# Patient Record
Sex: Female | Born: 1945 | Race: White | Hispanic: No | Marital: Married | Smoking: Never smoker
Health system: Southern US, Community
[De-identification: ages and names within clinical notes are randomized; demographics above are authoritative.]

## PROBLEM LIST (undated history)

## (undated) DIAGNOSIS — I781 Nevus, non-neoplastic: Secondary | ICD-10-CM

## (undated) DIAGNOSIS — L821 Other seborrheic keratosis: Secondary | ICD-10-CM

## (undated) DIAGNOSIS — E785 Hyperlipidemia, unspecified: Secondary | ICD-10-CM

## (undated) DIAGNOSIS — D1801 Hemangioma of skin and subcutaneous tissue: Secondary | ICD-10-CM

## (undated) DIAGNOSIS — M810 Age-related osteoporosis without current pathological fracture: Secondary | ICD-10-CM

## (undated) DIAGNOSIS — M8580 Other specified disorders of bone density and structure, unspecified site: Secondary | ICD-10-CM

## (undated) DIAGNOSIS — M2042 Other hammer toe(s) (acquired), left foot: Secondary | ICD-10-CM

## (undated) DIAGNOSIS — I10 Essential (primary) hypertension: Secondary | ICD-10-CM

## (undated) DIAGNOSIS — F419 Anxiety disorder, unspecified: Secondary | ICD-10-CM

## (undated) DIAGNOSIS — M2041 Other hammer toe(s) (acquired), right foot: Secondary | ICD-10-CM

## (undated) DIAGNOSIS — F329 Major depressive disorder, single episode, unspecified: Secondary | ICD-10-CM

## (undated) DIAGNOSIS — M2012 Hallux valgus (acquired), left foot: Secondary | ICD-10-CM

## (undated) DIAGNOSIS — D72819 Decreased white blood cell count, unspecified: Secondary | ICD-10-CM

## (undated) DIAGNOSIS — A048 Other specified bacterial intestinal infections: Secondary | ICD-10-CM

## (undated) DIAGNOSIS — M419 Scoliosis, unspecified: Secondary | ICD-10-CM

## (undated) DIAGNOSIS — I351 Nonrheumatic aortic (valve) insufficiency: Secondary | ICD-10-CM

## (undated) DIAGNOSIS — E7800 Pure hypercholesterolemia, unspecified: Secondary | ICD-10-CM

## (undated) DIAGNOSIS — D649 Anemia, unspecified: Secondary | ICD-10-CM

## (undated) DIAGNOSIS — F32A Depression, unspecified: Secondary | ICD-10-CM

## (undated) DIAGNOSIS — L309 Dermatitis, unspecified: Secondary | ICD-10-CM

## (undated) DIAGNOSIS — E559 Vitamin D deficiency, unspecified: Secondary | ICD-10-CM

## (undated) DIAGNOSIS — I340 Nonrheumatic mitral (valve) insufficiency: Secondary | ICD-10-CM

## (undated) HISTORY — DX: Dermatitis, unspecified: L30.9

## (undated) HISTORY — DX: Nonrheumatic aortic (valve) insufficiency: I35.1

## (undated) HISTORY — DX: Decreased white blood cell count, unspecified: D72.819

## (undated) HISTORY — DX: Other seborrheic keratosis: L82.1

## (undated) HISTORY — DX: Age-related osteoporosis without current pathological fracture: M81.0

## (undated) HISTORY — DX: Scoliosis, unspecified: M41.9

## (undated) HISTORY — DX: Hemangioma of skin and subcutaneous tissue: D18.01

## (undated) HISTORY — PX: COLONOSCOPY: SHX174

## (undated) HISTORY — DX: Major depressive disorder, single episode, unspecified: F32.9

## (undated) HISTORY — DX: Anxiety disorder, unspecified: F41.9

## (undated) HISTORY — DX: Depression, unspecified: F32.A

## (undated) HISTORY — DX: Essential (primary) hypertension: I10

## (undated) HISTORY — DX: Anemia, unspecified: D64.9

## (undated) HISTORY — PX: TUBAL LIGATION: SHX77

## (undated) HISTORY — DX: Hyperlipidemia, unspecified: E78.5

## (undated) HISTORY — DX: Nevus, non-neoplastic: I78.1

## (undated) HISTORY — PX: BUNIONECTOMY: SHX129

## (undated) HISTORY — PX: TONSILLECTOMY: SUR1361

## (undated) HISTORY — PX: OTHER SURGICAL HISTORY: SHX169

## (undated) HISTORY — DX: Hallux valgus (acquired), left foot: M20.12

## (undated) HISTORY — DX: Other specified bacterial intestinal infections: A04.8

## (undated) HISTORY — DX: Vitamin D deficiency, unspecified: E55.9

## (undated) HISTORY — DX: Nonrheumatic mitral (valve) insufficiency: I34.0

## (undated) HISTORY — DX: Pure hypercholesterolemia, unspecified: E78.00

## (undated) HISTORY — DX: Other specified disorders of bone density and structure, unspecified site: M85.80

## (undated) HISTORY — DX: Other hammer toe(s) (acquired), left foot: M20.42

## (undated) HISTORY — DX: Other hammer toe(s) (acquired), right foot: M20.41

---

## 1997-05-10 ENCOUNTER — Other Ambulatory Visit: Admission: RE | Admit: 1997-05-10 | Discharge: 1997-05-10 | Payer: Self-pay | Admitting: *Deleted

## 1997-07-02 ENCOUNTER — Other Ambulatory Visit: Admission: RE | Admit: 1997-07-02 | Discharge: 1997-07-02 | Payer: Self-pay | Admitting: *Deleted

## 1998-05-21 ENCOUNTER — Other Ambulatory Visit: Admission: RE | Admit: 1998-05-21 | Discharge: 1998-05-21 | Payer: Self-pay | Admitting: *Deleted

## 1998-06-25 ENCOUNTER — Encounter: Payer: Self-pay | Admitting: Emergency Medicine

## 1998-06-25 ENCOUNTER — Emergency Department (HOSPITAL_COMMUNITY): Admission: EM | Admit: 1998-06-25 | Discharge: 1998-06-25 | Payer: Self-pay | Admitting: Emergency Medicine

## 1999-05-27 ENCOUNTER — Other Ambulatory Visit: Admission: RE | Admit: 1999-05-27 | Discharge: 1999-05-27 | Payer: Self-pay | Admitting: *Deleted

## 2000-05-31 ENCOUNTER — Other Ambulatory Visit: Admission: RE | Admit: 2000-05-31 | Discharge: 2000-05-31 | Payer: Self-pay | Admitting: *Deleted

## 2002-05-03 ENCOUNTER — Other Ambulatory Visit: Admission: RE | Admit: 2002-05-03 | Discharge: 2002-05-03 | Payer: Self-pay | Admitting: Internal Medicine

## 2003-10-11 ENCOUNTER — Ambulatory Visit (HOSPITAL_COMMUNITY): Admission: RE | Admit: 2003-10-11 | Discharge: 2003-10-11 | Payer: Self-pay | Admitting: *Deleted

## 2003-10-11 ENCOUNTER — Encounter (INDEPENDENT_AMBULATORY_CARE_PROVIDER_SITE_OTHER): Payer: Self-pay | Admitting: Specialist

## 2006-07-04 ENCOUNTER — Other Ambulatory Visit: Admission: RE | Admit: 2006-07-04 | Discharge: 2006-07-04 | Payer: Self-pay | Admitting: Internal Medicine

## 2009-11-03 ENCOUNTER — Other Ambulatory Visit
Admission: RE | Admit: 2009-11-03 | Discharge: 2009-11-03 | Payer: Self-pay | Source: Home / Self Care | Admitting: Internal Medicine

## 2009-12-30 ENCOUNTER — Encounter: Admission: RE | Admit: 2009-12-30 | Discharge: 2009-12-30 | Payer: Self-pay | Admitting: Internal Medicine

## 2010-01-15 ENCOUNTER — Encounter
Admission: RE | Admit: 2010-01-15 | Discharge: 2010-01-15 | Payer: Self-pay | Source: Home / Self Care | Attending: Internal Medicine | Admitting: Internal Medicine

## 2010-01-28 ENCOUNTER — Ambulatory Visit (HOSPITAL_COMMUNITY)
Admission: RE | Admit: 2010-01-28 | Discharge: 2010-01-28 | Payer: Self-pay | Source: Home / Self Care | Attending: Cardiology | Admitting: Cardiology

## 2010-01-28 ENCOUNTER — Encounter: Payer: Self-pay | Admitting: Cardiology

## 2010-01-28 ENCOUNTER — Ambulatory Visit: Payer: Self-pay

## 2010-02-03 ENCOUNTER — Ambulatory Visit: Payer: Self-pay | Admitting: Cardiology

## 2010-02-22 ENCOUNTER — Encounter: Payer: Self-pay | Admitting: Internal Medicine

## 2010-06-19 NOTE — Op Note (Signed)
NAMEJUSTYCE, YEATER                          ACCOUNT NO.:  [id number]   MEDICAL RECORD NO.:  [id number]                   PATIENT TYPE:  AMB   LOCATION:  ENDO                                 FACILITY:  [facility]   PHYSICIAN:  Georgiana Spinner, M.D.                 DATE OF BIRTH:  [birth year]   DATE OF PROCEDURE:  DATE OF DISCHARGE:                                 OPERATIVE REPORT   PROCEDURE:  Colonoscopy.   INDICATIONS:  Colon cancer screening.   ANESTHESIA:  Demerol 20, Versed 3 mg.   PROCEDURE:  With the patient mildly sedated in the left lateral decubitus  position, the Olympus videoscopic colonoscope was inserted into the rectum  and passed under direct vision to the cecum, identified by the ileocecal  valve and the appendiceal orifice, both of which were photographed.  From  this point, the colonoscope was then slowly withdrawn, taking  circumferential views of the colonic mucosa, stopping only in the rectum,  which appeared normal on direct and showed hemorrhoids on retroflexed view.  The endoscope was straightened and withdrawn.  Patient's vital signs and  pulse oximetry remained stable.  Patient tolerated the procedure well  without apparent complications.   FINDINGS:  Internal hemorrhoids, otherwise unremarkable examination.   PLAN:  See endoscopy note for further details.                                               Georgiana Spinner, M.D.    GMO/MEDQ  D:  10/11/2003  T:  10/11/2003  Job:  [id number]

## 2010-06-19 NOTE — Op Note (Signed)
Kathleen Thomas, Kathleen Thomas                          ACCOUNT NO.:  [id number]   MEDICAL RECORD NO.:  [id number]                   PATIENT TYPE:  AMB   LOCATION:  ENDO                                 FACILITY:  [facility]   PHYSICIAN:  Georgiana Spinner, M.D.                 DATE OF BIRTH:  03-17-1945   DATE OF PROCEDURE:  10/11/2003  DATE OF DISCHARGE:                                 OPERATIVE REPORT   PROCEDURE:  Upper endoscopy with biopsy.   INDICATIONS FOR PROCEDURE:  Gastroesophageal reflux disease.   ANESTHESIA:  Demerol 50, Versed 5 mg.   DESCRIPTION OF PROCEDURE:  With the patient mildly sedated in the left  lateral decubitus position, the Olympus videoscopic endoscope was inserted  in the mouth and passed under direct vision through the esophagus which  appeared normal.  In the area of the distal esophagus, these Z line appeared  somewhat proximal to where the anatomical change was and therefore I  photographed and biopsied this to rule out Barrett's.  We entered in the  stomach. The fundus, body, antrum, duodenal bulb and second portion of the  duodenum all appeared normal. From this point, the endoscope was slowly  withdrawn taking circumferential views of the duodenal mucosa until the  endoscope was then pulled back in the stomach, placed in retroflexion to  view the stomach from below. The endoscope was straightened and withdrawn  taking circumferential views of the remaining gastric and esophageal mucosa.  The patient's vital signs and pulse oximeter remained stable. The patient  tolerated the procedure well without apparent complications.   FINDINGS:  Question of Barrett's esophagus biopsied, await biopsy report.  The patient will call me for results and followup with me as an outpatient.  Proceed to colonoscopy.                                               Georgiana Spinner, M.D.    GMO/MEDQ  D:  10/11/2003  T:  10/11/2003  Job:  [id number]

## 2010-12-10 ENCOUNTER — Other Ambulatory Visit: Payer: Self-pay | Admitting: Internal Medicine

## 2010-12-10 DIAGNOSIS — Z1231 Encounter for screening mammogram for malignant neoplasm of breast: Secondary | ICD-10-CM

## 2011-01-20 ENCOUNTER — Encounter: Payer: Self-pay | Admitting: *Deleted

## 2011-01-20 DIAGNOSIS — I351 Nonrheumatic aortic (valve) insufficiency: Secondary | ICD-10-CM | POA: Insufficient documentation

## 2011-01-20 DIAGNOSIS — I340 Nonrheumatic mitral (valve) insufficiency: Secondary | ICD-10-CM | POA: Insufficient documentation

## 2011-01-22 ENCOUNTER — Ambulatory Visit (INDEPENDENT_AMBULATORY_CARE_PROVIDER_SITE_OTHER): Payer: PRIVATE HEALTH INSURANCE | Admitting: Cardiology

## 2011-01-22 ENCOUNTER — Ambulatory Visit: Payer: Self-pay

## 2011-01-22 ENCOUNTER — Ambulatory Visit
Admission: RE | Admit: 2011-01-22 | Discharge: 2011-01-22 | Disposition: A | Discharge status: Home / Self Care | Payer: PRIVATE HEALTH INSURANCE | Source: Ambulatory Visit | Attending: Internal Medicine | Admitting: Internal Medicine

## 2011-01-22 ENCOUNTER — Encounter: Payer: Self-pay | Admitting: Cardiology

## 2011-01-22 VITALS — BP 120/78 | Ht 66.0 in | Wt 138.0 lb

## 2011-01-22 DIAGNOSIS — R002 Palpitations: Secondary | ICD-10-CM

## 2011-01-22 DIAGNOSIS — R634 Abnormal weight loss: Secondary | ICD-10-CM | POA: Insufficient documentation

## 2011-01-22 DIAGNOSIS — E785 Hyperlipidemia, unspecified: Secondary | ICD-10-CM

## 2011-01-22 DIAGNOSIS — I119 Hypertensive heart disease without heart failure: Secondary | ICD-10-CM

## 2011-01-22 DIAGNOSIS — I359 Nonrheumatic aortic valve disorder, unspecified: Secondary | ICD-10-CM

## 2011-01-22 DIAGNOSIS — I340 Nonrheumatic mitral (valve) insufficiency: Secondary | ICD-10-CM

## 2011-01-22 DIAGNOSIS — Z1231 Encounter for screening mammogram for malignant neoplasm of breast: Secondary | ICD-10-CM

## 2011-01-22 DIAGNOSIS — I493 Ventricular premature depolarization: Secondary | ICD-10-CM

## 2011-01-22 DIAGNOSIS — I4949 Other premature depolarization: Secondary | ICD-10-CM

## 2011-01-22 LAB — BASIC METABOLIC PANEL
BUN: 10 mg/dL (ref 6–23)
CO2: 29 mEq/L (ref 19–32)
Calcium: 9.4 mg/dL (ref 8.4–10.5)
Chloride: 106 mEq/L (ref 96–112)
Creatinine, Ser: 0.7 mg/dL (ref 0.4–1.2)
Glucose, Bld: 86 mg/dL (ref 70–99)

## 2011-01-22 NOTE — Assessment & Plan Note (Signed)
The patient has lost 12 pounds without trying over the past year.  She has not been having any symptoms of hyperthyroidism or hypothyroidism.  She is not sure if she has had any thyroid function studies recently and we will check a TSH.  We're also checking a basal metabolic panel today because of her PVCs.

## 2011-01-22 NOTE — Assessment & Plan Note (Signed)
The patient has not been experiencing any symptoms of congestive heart she is now on Cozaar for high blood pressure but this will also function as afterload reduction for her mitral regurgitation her aortic valve regurgitation.

## 2011-01-22 NOTE — Patient Instructions (Signed)
Will obtain labs today and call you with the results  Your physician recommends that you continue on your current medications as directed. Please refer to the Current Medication list given to you today.  Your physician wants you to follow-up in: 1 year---will get 2 D Echo prior to appointment You will receive a reminder letter in the mail two months in advance. If you don't receive a letter, please call our office to schedule the follow-up appointment.

## 2011-01-22 NOTE — Assessment & Plan Note (Signed)
Her PVCs are mildly symptomatic.  We will check electrolytes to be sure she is not hypokalemic.  It would be unlikely since she is on an ARB.  If no electrolyte imbalance is found the patient did not think she would want to go on beta blocker at this point unless the PVCs become more symptomatic

## 2011-01-22 NOTE — Progress Notes (Signed)
Kathleen Thomas Date of Birth:  1945-08-27 [facility] [address]   [address] [place], [place]  [address] [phone]           Fax   [phone]  History of Present Illness: This pleasant 65 year old woman is seen for a one-year followup office visit.  She has a history of palpitations and history of a heart murmur.  More recently she was started on blood pressure medicine by her primary care provider.  Last echocardiogram on 01/28/10 and at that time showed mild LVH, normal left systolic function with an ejection fraction 55-65%, and no wall motion abnormalities.  She also had grade 1 diastolic dysfunction.  She had mild aortic valve regurgitation and mild mitral valve regurgitation and the left atrium was moderately dilated.  She has not been having any symptoms of congestive heart failure.  She has lost 12 pounds in the past year without trying.  Current Outpatient Prescriptions  Medication Sig Dispense Refill  . aspirin 81 MG tablet Take 81 mg by mouth daily. Taking 3 x a week      . CALCIUM PO Take by mouth daily.        Marland Kitchen losartan (COZAAR) 100 MG tablet Take 100 mg by mouth daily.        . Multiple Vitamin (MULTIVITAMIN) tablet Take 1 tablet by mouth daily.        . Omega-3 Fatty Acids (FISH OIL PO) Take by mouth daily.          No Known Allergies  Patient Active Problem List  Diagnoses  . Mitral regurgitation  . Aortic insufficiency    History  Smoking status  . Never Smoker   Smokeless tobacco  . Not on file    History  Alcohol Use     No family history on file.  Review of Systems: Constitutional: no fever chills diaphoresis or fatigue or change in weight.  Head and neck: no hearing loss, no epistaxis, no photophobia or visual disturbance. Respiratory: No cough, shortness of breath or wheezing. Cardiovascular: No chest pain peripheral edema, palpitations. Gastrointestinal: No abdominal distention, no abdominal pain, no change  in bowel habits hematochezia or melena. Genitourinary: No dysuria, no frequency, no urgency, no nocturia. Musculoskeletal:No arthralgias, no back pain, no gait disturbance or myalgias. Neurological: No dizziness, no headaches, no numbness, no seizures, no syncope, no weakness, no tremors. Hematologic: No lymphadenopathy, no easy bruising. Psychiatric: No confusion, no hallucinations, no sleep disturbance.    Physical Exam: Filed Vitals:   01/22/11 1349  BP: 120/78   The general appearance reveals a well-developed well-nourished woman in no distress.Pupils equal and reactive.   Extraocular Movements are full.  There is no scleral icterus.  The mouth and pharynx are normal.  The neck is supple.  The carotids reveal no bruits.  The jugular venous pressure is normal.  The thyroid is not enlarged.  There is no lymphadenopathy.  The chest is clear to percussion and auscultation. There are no rales or rhonchi. Expansion of the chest is symmetrical.  Heart reveals a soft systolic ejection murmur at the base.  No diastolic murmur is heard.The abdomen is soft and nontender. Bowel sounds are normal. The liver and spleen are not enlarged. There Are no abdominal masses. There are no bruits.  The pedal pulses are good.  There is no phlebitis or edema.  There is no cyanosis or clubbing. Strength is normal and symmetrical in all extremities.  There is no  lateralizing weakness.  There are no sensory deficits.  The skin is warm and dry.  There is no rash.  EKG shows normal sinus rhythm with frequent PVCs in trigeminy.  She also has nonspecific ST and T wave abnormalities which have increased slightly since prior EKG  Assessment / Plan: Continue same medication.  Await results of blood work.  Recheck in one year for office visit EKG and get a two-dimensional echocardiogram prior to the office visit.

## 2011-01-27 NOTE — Progress Notes (Signed)
lmtcb 12/26 aeh

## 2011-01-29 ENCOUNTER — Telehealth: Payer: Self-pay | Admitting: *Deleted

## 2011-01-29 NOTE — Telephone Encounter (Signed)
Mailed copy of labs and left message to call if any questions. Did highlight Dr Yevonne Pax comments

## 2011-01-29 NOTE — Telephone Encounter (Signed)
Message copied by Burnell Blanks on Fri Jan 29, 2011  9:11 AM ------      Message from: Cassell Clement      Created: Sat Jan 23, 2011 11:48 AM       Thyroid is normal.  K is borderline low so increase bananas and high K foods.

## 2011-02-03 ENCOUNTER — Telehealth: Payer: Self-pay | Admitting: Cardiology

## 2011-02-03 NOTE — Telephone Encounter (Signed)
noted 

## 2011-02-03 NOTE — Telephone Encounter (Signed)
New msg Pt wanted you to know she received info you sent to her

## 2011-12-16 ENCOUNTER — Other Ambulatory Visit: Payer: Self-pay | Admitting: Internal Medicine

## 2011-12-16 DIAGNOSIS — Z1231 Encounter for screening mammogram for malignant neoplasm of breast: Secondary | ICD-10-CM

## 2011-12-28 ENCOUNTER — Other Ambulatory Visit: Payer: Self-pay | Admitting: Dermatology

## 2012-01-20 ENCOUNTER — Other Ambulatory Visit (HOSPITAL_COMMUNITY): Payer: Self-pay | Admitting: Cardiology

## 2012-01-20 DIAGNOSIS — I359 Nonrheumatic aortic valve disorder, unspecified: Secondary | ICD-10-CM

## 2012-01-24 ENCOUNTER — Ambulatory Visit (HOSPITAL_COMMUNITY): Payer: No Typology Code available for payment source | Attending: Cardiology

## 2012-01-24 ENCOUNTER — Ambulatory Visit
Admission: RE | Admit: 2012-01-24 | Discharge: 2012-01-24 | Disposition: A | Discharge status: Home / Self Care | Payer: PRIVATE HEALTH INSURANCE | Source: Ambulatory Visit | Attending: Internal Medicine | Admitting: Internal Medicine

## 2012-01-24 ENCOUNTER — Telehealth: Payer: Self-pay | Admitting: *Deleted

## 2012-01-24 DIAGNOSIS — I059 Rheumatic mitral valve disease, unspecified: Secondary | ICD-10-CM | POA: Insufficient documentation

## 2012-01-24 DIAGNOSIS — Z1231 Encounter for screening mammogram for malignant neoplasm of breast: Secondary | ICD-10-CM

## 2012-01-24 DIAGNOSIS — E785 Hyperlipidemia, unspecified: Secondary | ICD-10-CM | POA: Insufficient documentation

## 2012-01-24 DIAGNOSIS — I359 Nonrheumatic aortic valve disorder, unspecified: Secondary | ICD-10-CM | POA: Insufficient documentation

## 2012-01-24 NOTE — Telephone Encounter (Signed)
Advised patient

## 2012-01-24 NOTE — Progress Notes (Signed)
Echocardiogram performed.  

## 2012-01-24 NOTE — Telephone Encounter (Signed)
Message copied by Burnell Blanks on Mon Jan 24, 2012  6:17 PM ------      Message from: Cassell Clement      Created: Mon Jan 24, 2012  3:03 PM       Please report.  The echocardiogram is stable.  Discuss further at [facility] office visit.  Please make a copy for office visit.

## 2012-02-01 ENCOUNTER — Encounter: Payer: Self-pay | Admitting: Cardiology

## 2012-02-01 ENCOUNTER — Ambulatory Visit (INDEPENDENT_AMBULATORY_CARE_PROVIDER_SITE_OTHER): Payer: PRIVATE HEALTH INSURANCE | Admitting: Cardiology

## 2012-02-01 VITALS — BP 123/66 | HR 70 | Resp 18 | Ht 66.0 in | Wt 138.0 lb

## 2012-02-01 DIAGNOSIS — I340 Nonrheumatic mitral (valve) insufficiency: Secondary | ICD-10-CM

## 2012-02-01 DIAGNOSIS — R634 Abnormal weight loss: Secondary | ICD-10-CM

## 2012-02-01 DIAGNOSIS — I359 Nonrheumatic aortic valve disorder, unspecified: Secondary | ICD-10-CM

## 2012-02-01 DIAGNOSIS — I059 Rheumatic mitral valve disease, unspecified: Secondary | ICD-10-CM

## 2012-02-01 DIAGNOSIS — I493 Ventricular premature depolarization: Secondary | ICD-10-CM

## 2012-02-01 DIAGNOSIS — I4949 Other premature depolarization: Secondary | ICD-10-CM

## 2012-02-01 DIAGNOSIS — I351 Nonrheumatic aortic (valve) insufficiency: Secondary | ICD-10-CM

## 2012-02-01 NOTE — Assessment & Plan Note (Signed)
The patient is not having any orthopnea or paroxysmal nocturnal dyspnea.  No peripheral edema.  No evidence of CHF.

## 2012-02-01 NOTE — Assessment & Plan Note (Signed)
Her weight has been stable and unchanged over the past 12 months

## 2012-02-01 NOTE — Progress Notes (Signed)
Kathleen Thomas Date of Birth:  03/01/1945 [facility] [address] [place], [place]  [address] [phone]         Fax   [phone]  History of Present Illness: This pleasant 66 year old woman is seen for a one-year followup office visit. She has a history of palpitations and history of a heart murmur. More recently she was started on blood pressure medicine by her primary care provider. Last echocardiogram on 01/28/10 and at that time showed mild LVH, normal left systolic function with an ejection fraction 55-65%, and no wall motion abnormalities. She also had grade 1 diastolic dysfunction. She had mild aortic valve regurgitation and mild mitral valve regurgitation and the left atrium was moderately dilated.  The patient had an updated echocardiogram on 01/24/12 which again showed an ejection fraction of 55-65% with mild aortic insufficiency and mild mitral regurgitation. She has not been having any symptoms of congestive heart failure. She has not gained or lost any weight since we saw her a year ago.   Current Outpatient Prescriptions  Medication Sig Dispense Refill  . CALCIUM PO Take by mouth daily.        . cyanocobalamin 500 MCG tablet Take 500 mcg by mouth daily.      Marland Kitchen DIOVAN 320 MG tablet       . Magnesium Oxide (MAG-200 PO) Take by mouth. AS DIRECTED.      . Multiple Vitamin (MULTIVITAMIN) tablet Take 1 tablet by mouth daily.          No Known Allergies  Patient Active Problem List  Diagnosis  . Mitral regurgitation  . Aortic insufficiency  . PVCs (premature ventricular contractions)  . Weight loss, unintentional    History  Smoking status  . Never Smoker   Smokeless tobacco  . Not on file    History  Alcohol Use     No family history on file.  Review of Systems: Constitutional: no fever chills diaphoresis or fatigue or change in weight.  Head and neck: no hearing loss, no epistaxis, no photophobia or visual disturbance. Respiratory:  No cough, shortness of breath or wheezing. Cardiovascular: No chest pain peripheral edema, palpitations. Gastrointestinal: No abdominal distention, no abdominal pain, no change in bowel habits hematochezia or melena. Genitourinary: No dysuria, no frequency, no urgency, no nocturia. Musculoskeletal:No arthralgias, no back pain, no gait disturbance or myalgias. Neurological: No dizziness, no headaches, no numbness, no seizures, no syncope, no weakness, no tremors. Hematologic: No lymphadenopathy, no easy bruising. Psychiatric: No confusion, no hallucinations, no sleep disturbance.    Physical Exam: Filed Vitals:   02/01/12 1019  BP: 123/66  Pulse: 70  Resp: 18   the general appearance reveals a well-developed well-nourished woman in no distress.The head and neck exam reveals pupils equal and reactive.  Extraocular movements are full.  There is no scleral icterus.  The mouth and pharynx are normal.  The neck is supple.  The carotids reveal no bruits.  The jugular venous pressure is normal.  The  thyroid is not enlarged.  There is no lymphadenopathy.  The chest is clear to percussion and auscultation.  There are no rales or rhonchi.  Expansion of the chest is symmetrical.  The precordium is quiet.  The first heart sound is normal.  The second heart sound is physiologically split.  There is no murmur gallop rub or click.  There is no abnormal lift or heave.  The abdomen is soft and nontender.  The bowel sounds are normal.  The liver and spleen are not enlarged.  There are no abdominal masses.  There are no abdominal bruits.  Extremities reveal good pedal pulses.  There is no phlebitis or edema.  There is no cyanosis or clubbing.  Strength is normal and symmetrical in all extremities.  There is no lateralizing weakness.  There are no sensory deficits.  The skin is warm and dry.  There is no rash.  EKG shows normal sinus rhythm and since 01/22/11, nonspecific T-wave changes have improved.   Assessment  / Plan: She will continue same medication.  We will plan to recheck her for an office visit and EKG in one year.

## 2012-02-01 NOTE — Assessment & Plan Note (Signed)
The patient has not been experiencing any awareness of PVCs.  She first wakes up in the morning she is aware of her heartbeat but it is not fast or irregular.  When she starts moving around and is active she no longer is aware of any heartbeat problem.

## 2012-02-01 NOTE — Patient Instructions (Addendum)
Your physician recommends that you continue on your current medications as directed. Please refer to the Current Medication list given to you today.  Your physician wants you to follow-up in: 1 year. You will receive a reminder letter in the mail two months in advance. If you don't receive a letter, please call our office to schedule the follow-up appointment.  

## 2012-10-24 ENCOUNTER — Other Ambulatory Visit: Payer: Self-pay

## 2012-10-24 DIAGNOSIS — Z1231 Encounter for screening mammogram for malignant neoplasm of breast: Secondary | ICD-10-CM

## 2013-01-19 ENCOUNTER — Ambulatory Visit: Payer: PRIVATE HEALTH INSURANCE | Admitting: Cardiology

## 2013-01-29 ENCOUNTER — Ambulatory Visit: Payer: PRIVATE HEALTH INSURANCE

## 2013-02-02 ENCOUNTER — Other Ambulatory Visit: Payer: Self-pay

## 2013-02-05 ENCOUNTER — Ambulatory Visit
Admission: RE | Admit: 2013-02-05 | Discharge: 2013-02-05 | Disposition: A | Discharge status: Home / Self Care | Payer: No Typology Code available for payment source | Source: Ambulatory Visit

## 2013-02-05 ENCOUNTER — Encounter: Payer: Self-pay | Admitting: Cardiology

## 2013-02-05 ENCOUNTER — Ambulatory Visit (INDEPENDENT_AMBULATORY_CARE_PROVIDER_SITE_OTHER): Payer: No Typology Code available for payment source | Admitting: Cardiology

## 2013-02-05 VITALS — BP 122/82 | HR 60 | Ht 66.0 in | Wt 140.0 lb

## 2013-02-05 DIAGNOSIS — I359 Nonrheumatic aortic valve disorder, unspecified: Secondary | ICD-10-CM

## 2013-02-05 DIAGNOSIS — I340 Nonrheumatic mitral (valve) insufficiency: Secondary | ICD-10-CM

## 2013-02-05 DIAGNOSIS — I059 Rheumatic mitral valve disease, unspecified: Secondary | ICD-10-CM

## 2013-02-05 DIAGNOSIS — Z1231 Encounter for screening mammogram for malignant neoplasm of breast: Secondary | ICD-10-CM

## 2013-02-05 DIAGNOSIS — I493 Ventricular premature depolarization: Secondary | ICD-10-CM

## 2013-02-05 DIAGNOSIS — I4949 Other premature depolarization: Secondary | ICD-10-CM

## 2013-02-05 DIAGNOSIS — I351 Nonrheumatic aortic (valve) insufficiency: Secondary | ICD-10-CM

## 2013-02-05 NOTE — Progress Notes (Signed)
Rica Koyanagi Date of Birth:  23-May-1945 [facility] [address] [place], [place]  [address] [phone]         Fax   [phone]  History of Present Illness: This pleasant 68 year old woman is seen for a one-year followup office visit. She has a history of palpitations and history of a heart murmur. More recently she was started on blood pressure medicine by her primary care provider. Last echocardiogram on 01/28/10 and at that time showed mild LVH, normal left systolic function with an ejection fraction 55-65%, and no wall motion abnormalities. She also had grade 1 diastolic dysfunction. She had mild aortic valve regurgitation and mild mitral valve regurgitation and the left atrium was moderately dilated.  The patient had an updated echocardiogram on 01/24/12 which again showed an ejection fraction of 55-65% with mild aortic insufficiency and mild mitral regurgitation.  Since last visit she has been feeling well.  On her own she has stopped taking her Diovan.  She felt that she could control her blood pressure without medication.  Current Outpatient Prescriptions  Medication Sig Dispense Refill  . CALCIUM PO Take by mouth daily.        . cyanocobalamin 500 MCG tablet Take 500 mcg by mouth daily.      . Multiple Vitamin (MULTIVITAMIN) tablet Take 1 tablet by mouth daily.         No current facility-administered medications for this visit.    No Known Allergies  Patient Active Problem List   Diagnosis Date Noted  . PVCs (premature ventricular contractions) 01/22/2011  . Weight loss, unintentional 01/22/2011  . Mitral regurgitation   . Aortic insufficiency     History  Smoking status  . Never Smoker   Smokeless tobacco  . Not on file    History  Alcohol Use     No family history on file.  Review of Systems: Constitutional: no fever chills diaphoresis or fatigue or change in weight.  Head and neck: no hearing loss, no epistaxis, no photophobia or  visual disturbance. Respiratory: No cough, shortness of breath or wheezing. Cardiovascular: No chest pain peripheral edema, palpitations. Gastrointestinal: No abdominal distention, no abdominal pain, no change in bowel habits hematochezia or melena. Genitourinary: No dysuria, no frequency, no urgency, no nocturia. Musculoskeletal:No arthralgias, no back pain, no gait disturbance or myalgias. Neurological: No dizziness, no headaches, no numbness, no seizures, no syncope, no weakness, no tremors. Hematologic: No lymphadenopathy, no easy bruising. Psychiatric: No confusion, no hallucinations, no sleep disturbance.    Physical Exam: Filed Vitals:   02/05/13 1635  BP: 122/82  Pulse: 60   the general appearance reveals a well-developed well-nourished woman in no distress.The head and neck exam reveals pupils equal and reactive.  Extraocular movements are full.  There is no scleral icterus.  The mouth and pharynx are normal.  The neck is supple.  The carotids reveal no bruits.  The jugular venous pressure is normal.  The  thyroid is not enlarged.  There is no lymphadenopathy.  The chest is clear to percussion and auscultation.  There are no rales or rhonchi.  Expansion of the chest is symmetrical.  The precordium is quiet.  The first heart sound is normal.  The second heart sound is physiologically split.  There is no murmur gallop rub or click.  There is no abnormal lift or heave.  The abdomen is soft and nontender.  The bowel sounds are normal.  The liver and spleen are not enlarged.  There are no abdominal masses.  There are no abdominal bruits.  Extremities reveal good pedal pulses.  There is no phlebitis or edema.  There is no cyanosis or clubbing.  Strength is normal and symmetrical in all extremities.  There is no lateralizing weakness.  There are no sensory deficits.  The skin is warm and dry.  There is no rash.  EKG today shows normal sinus rhythm and is within normal limits at 60 per  minute  Assessment / Plan: She will continue same medication.  We will plan to recheck her for an office visit and EKG in one year.  She will monitor her blood pressure at home.  If he begins to go back up she will restart the Diovan.

## 2013-02-05 NOTE — Assessment & Plan Note (Signed)
The patient is not having any symptoms of congestive heart failure.  No orthopnea or paroxysmal nocturnal dyspnea.  No ankle edema.

## 2013-02-05 NOTE — Patient Instructions (Signed)
Your physician recommends that you continue on your current medications as directed. Please refer to the Current Medication list given to you today.  Your physician wants you to follow-up in: 1 year ov You will receive a reminder letter in the mail two months in advance. If you don't receive a letter, please call our office to schedule the follow-up appointment.  

## 2013-02-05 NOTE — Assessment & Plan Note (Signed)
The patient has not been experiencing any palpitations or PVCs.

## 2013-12-31 ENCOUNTER — Other Ambulatory Visit: Payer: Self-pay | Admitting: Dermatology

## 2014-01-09 ENCOUNTER — Other Ambulatory Visit: Payer: Self-pay

## 2014-01-09 DIAGNOSIS — Z1231 Encounter for screening mammogram for malignant neoplasm of breast: Secondary | ICD-10-CM

## 2014-02-07 ENCOUNTER — Encounter: Payer: Self-pay | Admitting: Cardiology

## 2014-02-07 ENCOUNTER — Ambulatory Visit
Admission: RE | Admit: 2014-02-07 | Discharge: 2014-02-07 | Disposition: A | Discharge status: Home / Self Care | Payer: No Typology Code available for payment source | Source: Ambulatory Visit | Attending: Cardiology | Admitting: Cardiology

## 2014-02-07 ENCOUNTER — Ambulatory Visit
Admission: RE | Admit: 2014-02-07 | Discharge: 2014-02-07 | Disposition: A | Discharge status: Home / Self Care | Payer: PRIVATE HEALTH INSURANCE | Source: Ambulatory Visit

## 2014-02-07 ENCOUNTER — Ambulatory Visit (INDEPENDENT_AMBULATORY_CARE_PROVIDER_SITE_OTHER): Payer: PRIVATE HEALTH INSURANCE | Admitting: Cardiology

## 2014-02-07 VITALS — BP 120/88 | HR 60 | Ht 66.5 in | Wt 146.4 lb

## 2014-02-07 DIAGNOSIS — I351 Nonrheumatic aortic (valve) insufficiency: Secondary | ICD-10-CM

## 2014-02-07 DIAGNOSIS — I340 Nonrheumatic mitral (valve) insufficiency: Secondary | ICD-10-CM

## 2014-02-07 DIAGNOSIS — Z1231 Encounter for screening mammogram for malignant neoplasm of breast: Secondary | ICD-10-CM

## 2014-02-07 NOTE — Progress Notes (Signed)
Kathleen Thomas Date of Birth:  March 09, 1945 [place] [address] [place], [place]  [address] [phone]        Fax   [phone]   History of Present Illness: This pleasant 69 year old woman is seen for a one-year followup office visit. She has a history of palpitations and history of a heart murmur. More recently she was started on blood pressure medicine valsartan by her primary care provider.  The patient had an updated echocardiogram on 01/24/12 which again showed an ejection fraction of 55-65% with mild aortic insufficiency and mild mitral regurgitation. Since last visit she has been feeling well. On her own she has stopped taking her Diovan but then she started taking it again. She initially felt that she could control her blood pressure without medication. She has not been expressing any chest pain or shortness of breath.  She sleeps on one pillow.  She has had no dizziness or syncope.  No racing of her heart.  Her weight has gone up since last visit.  Current Outpatient Prescriptions  Medication Sig Dispense Refill  . CALCIUM PO Take by mouth daily.      . Multiple Vitamin (MULTIVITAMIN) tablet Take 1 tablet by mouth daily.      . valsartan (DIOVAN) 160 MG tablet Take 160 mg by mouth daily.  4   No current facility-administered medications for this visit.    No Known Allergies  Patient Active Problem List   Diagnosis Date Noted  . PVCs (premature ventricular contractions) 01/22/2011  . Weight loss, unintentional 01/22/2011  . Mitral regurgitation   . Aortic insufficiency     History  Smoking status  . Never Smoker   Smokeless tobacco  . Not on file    History  Alcohol Use: Not on file    No family history on file.  Review of Systems: Constitutional: no fever chills diaphoresis or fatigue or change in weight.  Head and neck: no hearing loss, no epistaxis, no photophobia or visual disturbance. Respiratory: No cough, shortness of  breath or wheezing. Cardiovascular: No chest pain peripheral edema, palpitations. Gastrointestinal: No abdominal distention, no abdominal pain, no change in bowel habits hematochezia or melena. Genitourinary: No dysuria, no frequency, no urgency, no nocturia. Musculoskeletal:No arthralgias, no back pain, no gait disturbance or myalgias. Neurological: No dizziness, no headaches, no numbness, no seizures, no syncope, no weakness, no tremors. Hematologic: No lymphadenopathy, no easy bruising. Psychiatric: No confusion, no hallucinations, no sleep disturbance.   Wt Readings from Last 3 Encounters:  02/07/14 146 lb 6.4 oz (66.407 kg)  02/05/13 140 lb (63.504 kg)  02/01/12 138 lb (62.596 kg)    Physical Exam: Filed Vitals:   02/07/14 0911  BP: 120/88  Pulse: 60  The patient appears to be in no distress.  Head and neck exam reveals that the pupils are equal and reactive.  The extraocular movements are full.  There is no scleral icterus.  Mouth and pharynx are benign.  No lymphadenopathy.  No carotid bruits.  The jugular venous pressure is normal.  Thyroid is not enlarged or tender.  Chest is clear to percussion and auscultation.  No rales or rhonchi.  Expansion of the chest is symmetrical.  Heart reveals no abnormal lift or heave.  First and second heart sounds are normal.  There is no  gallop rub or click.  There is a faint apical systolic murmur.  I cannot appreciate any diastolic murmur of aortic insufficiency today.  The  abdomen is soft and nontender.  Bowel sounds are normoactive.  There is no hepatosplenomegaly or mass.  There are no abdominal bruits.  Extremities reveal no phlebitis or edema.  Pedal pulses are good.  There is no cyanosis or clubbing.  Neurologic exam is normal strength and no lateralizing weakness.  No sensory deficits.  Integument reveals no rash  EKG today shows normal sinus rhythm with incomplete right bundle branch block and no ischemic changes.  Assessment  / Plan: 1.  Mild mitral regurgitation 2.  Mild aortic insufficiency 3.  Past history of PVCs, improved with reducing caffeine in diet.  Disposition: We are going to get a chest x-ray on her today.  This will be to look at her heart size.  She does not recall when her last chest x-ray was.  Order to try to lose weight and increase her aerobic exercise.  We will recheck her in one year for a follow-up office visit and EKG.  Consider follow-up echo after that next visit.

## 2014-02-07 NOTE — Patient Instructions (Signed)
A chest x-ray takes a picture of the organs and structures inside the chest, including the heart, lungs, and blood vessels. This test can show several things, including, whether the heart is enlarges; whether fluid is building up in the lungs; and whether pacemaker / defibrillator leads are still in place. [place]  Your physician recommends that you continue on your current medications as directed. Please refer to the Current Medication list given to you today.  Your physician wants you to follow-up in: [place] will receive a reminder letter in the mail two months in advance. If you don't receive a letter, please call our office to schedule the follow-up appointment.   Work harder on weight loss and increase your exercising

## 2015-01-10 ENCOUNTER — Other Ambulatory Visit: Payer: Self-pay

## 2015-01-10 DIAGNOSIS — Z1231 Encounter for screening mammogram for malignant neoplasm of breast: Secondary | ICD-10-CM

## 2015-02-18 ENCOUNTER — Encounter: Payer: Self-pay | Admitting: Cardiology

## 2015-02-28 ENCOUNTER — Encounter: Payer: Self-pay | Admitting: Cardiology

## 2015-02-28 ENCOUNTER — Ambulatory Visit (INDEPENDENT_AMBULATORY_CARE_PROVIDER_SITE_OTHER): Payer: No Typology Code available for payment source | Admitting: Cardiology

## 2015-02-28 ENCOUNTER — Ambulatory Visit
Admission: RE | Admit: 2015-02-28 | Discharge: 2015-02-28 | Disposition: A | Discharge status: Home / Self Care | Payer: No Typology Code available for payment source | Source: Ambulatory Visit

## 2015-02-28 VITALS — BP 126/76 | HR 60 | Ht 66.5 in | Wt 147.0 lb

## 2015-02-28 DIAGNOSIS — I340 Nonrheumatic mitral (valve) insufficiency: Secondary | ICD-10-CM | POA: Diagnosis not present

## 2015-02-28 DIAGNOSIS — I351 Nonrheumatic aortic (valve) insufficiency: Secondary | ICD-10-CM | POA: Diagnosis not present

## 2015-02-28 DIAGNOSIS — Z1231 Encounter for screening mammogram for malignant neoplasm of breast: Secondary | ICD-10-CM

## 2015-02-28 NOTE — Progress Notes (Signed)
Cardiology Office Note   Date:  02/28/2015   ID:  [name], DOB 1945-06-15, MRN [id number]  PCP:  Horatio Pel, MD  Cardiologist: Darlin Coco MD  Chief Complaint  Patient presents with  . scheduled follow up    PVC's. Denies chest pain, shortness of breath, le edema, or claudication      History of Present Illness: Kathleen Thomas is a 70 y.o. female who presents for a one-year follow-up visit  She has a history of palpitations and history of a heart murmur.  She is on valsartan for mild hypertension. The patient had an updated echocardiogram on 01/24/12 which again showed an ejection fraction of 55-65% with mild aortic insufficiency and mild mitral regurgitation. Since last visit she has been feeling well.  She has not been expressing any chest pain or shortness of breath. She sleeps on one pillow. She has had no dizziness or syncope. No racing of her heart. Her weight has gone up since last visit.  She has not been aware of any recent PVCs. At her last visit a year ago we got a chest x-ray which showed normal heart size and no evidence of CHF  Past Medical History  Diagnosis Date  . Mitral regurgitation     mild  . Aortic insufficiency     mild    Past Surgical History  Procedure Laterality Date  . Tubal ligation    . Childbirth      x2     Current Outpatient Prescriptions  Medication Sig Dispense Refill  . CALCIUM PO Take by mouth daily.      . Cholecalciferol (VITAMIN D-3 PO) Take 1 tablet by mouth daily.    . Multiple Vitamin (MULTIVITAMIN) tablet Take 1 tablet by mouth daily.      . Omega-3 Fatty Acids (FISH OIL PO) Take 2 capsules by mouth daily.    . valsartan (DIOVAN) 160 MG tablet Take 160 mg by mouth daily.  4   No current facility-administered medications for this visit.    Allergies:   Review of patient's allergies indicates no known allergies.    Social History:  The patient  reports that she has never smoked. She does  not have any smokeless tobacco history on file.   Family History:  The patient's family history is not on file.    ROS:  Please see the history of present illness.   Otherwise, review of systems are positive for none.   All other systems are reviewed and negative.    PHYSICAL EXAM: VS:  BP 126/76 mmHg  Pulse 60  Ht 5' 6.5" (1.689 m)  Wt 147 lb (66.679 kg)  BMI 23.37 kg/m2 , BMI Body mass index is 23.37 kg/(m^2). GEN: Well nourished, well developed, in no acute distress HEENT: normal Neck: no JVD, carotid bruits, or masses Cardiac: RRR; there is a soft apical systolic murmur.  I do not hear any aortic insufficiency murmur today Respiratory:  clear to auscultation bilaterally, normal work of breathing GI: soft, nontender, nondistended, + BS MS: no deformity or atrophy Skin: warm and dry, no rash Neuro:  Strength and sensation are intact Psych: euthymic mood, full affect   EKG:  EKG is ordered today. The ekg ordered today demonstrates normal sinus rhythm.  Within normal limits.   Recent Labs: No results found for requested labs within last 365 days.    Lipid Panel No results found for: CHOL, TRIG, HDL, CHOLHDL, VLDL, LDLCALC, LDLDIRECT    Wt  Readings from Last 3 Encounters:  02/28/15 147 lb (66.679 kg)  02/07/14 146 lb 6.4 oz (66.407 kg)  02/05/13 140 lb (63.504 kg)        ASSESSMENT AND PLAN:  1. Mild mitral regurgitation 2. Mild aortic insufficiency 3. Past history of PVCs, improved with reducing caffeine in diet.   Current medicines are reviewed at length with the patient today.  The patient does not have concerns regarding medicines.  The following changes have been made:  no change  Labs/ tests ordered today include:   Orders Placed This Encounter  Procedures  . EKG 12-Lead  . ECHOCARDIOGRAM COMPLETE     Disposition:  Continue same medication.  We will get a follow-up echocardiogram to look at her aortic insufficiency and her mitral  regurgitation.  Recheck in one year for office visit with [name]  Signed, Darlin Coco MD 02/28/2015 1:51 PM    [facility] [address], [place], [place]  [address] Phone: [phone]; Fax: [phone]

## 2015-02-28 NOTE — Patient Instructions (Signed)
Medication Instructions:  Your physician recommends that you continue on your current medications as directed. Please refer to the Current Medication list given to you today.   Labwork: none  Testing/Procedures: Your physician has requested that you have an echocardiogram. Echocardiography is a painless test that uses sound waves to create images of your heart. It provides your doctor with information about the size and shape of your heart and how well your heart's chambers and valves are working. This procedure takes approximately one hour. There are no restrictions for this procedure.    Follow-Up: Your physician wants you to follow-up in: 12 months with [name]. You will receive a reminder letter in the mail two months in advance. If you don't receive a letter, please call our office to schedule the follow-up appointment.   Any Other Special Instructions Will Be Listed Below (If Applicable).     If you need a refill on your cardiac medications before your next appointment, please call your pharmacy.

## 2015-03-13 ENCOUNTER — Other Ambulatory Visit (HOSPITAL_COMMUNITY): Payer: No Typology Code available for payment source

## 2015-04-04 ENCOUNTER — Telehealth: Payer: Self-pay | Admitting: *Deleted

## 2015-04-04 NOTE — Telephone Encounter (Signed)
Patient cancelled echo Will forward to  Dr. Mare Ferrari so he will be aware

## 2015-12-02 ENCOUNTER — Telehealth: Payer: Self-pay | Admitting: Cardiology

## 2015-12-02 DIAGNOSIS — I351 Nonrheumatic aortic (valve) insufficiency: Secondary | ICD-10-CM

## 2015-12-02 DIAGNOSIS — I340 Nonrheumatic mitral (valve) insufficiency: Secondary | ICD-10-CM

## 2015-12-02 NOTE — Telephone Encounter (Signed)
Patient wants to know if she can get her echo done prior to her appt in Jan with [name].  Dr. Sherryl Barters last note indicated that she should have one but there are not orders in system

## 2015-12-02 NOTE — Telephone Encounter (Signed)
Spoke with pt. She would like to schedule echo prior to office visit in January with [name]. Was ordered after last office visit with Dr. Mare Ferrari but pt cancelled.  Pt reports she is not having any new problems and feels the same as when she last saw Dr. Mare Ferrari.  She is seeing [name] on January 24,2018.  I scheduled echo for January 15,2018.

## 2015-12-02 NOTE — Telephone Encounter (Signed)
Ivy, please schedule an echo prior to her next appointment.

## 2016-02-03 ENCOUNTER — Other Ambulatory Visit: Payer: Self-pay | Admitting: Internal Medicine

## 2016-02-03 DIAGNOSIS — Z1231 Encounter for screening mammogram for malignant neoplasm of breast: Secondary | ICD-10-CM

## 2016-02-16 ENCOUNTER — Other Ambulatory Visit: Payer: Self-pay

## 2016-02-16 ENCOUNTER — Ambulatory Visit (HOSPITAL_COMMUNITY): Payer: No Typology Code available for payment source | Attending: Cardiovascular Disease

## 2016-02-16 DIAGNOSIS — I351 Nonrheumatic aortic (valve) insufficiency: Secondary | ICD-10-CM | POA: Diagnosis not present

## 2016-02-16 DIAGNOSIS — I517 Cardiomegaly: Secondary | ICD-10-CM | POA: Insufficient documentation

## 2016-02-16 DIAGNOSIS — I340 Nonrheumatic mitral (valve) insufficiency: Secondary | ICD-10-CM | POA: Insufficient documentation

## 2016-02-24 NOTE — Progress Notes (Signed)
Cardiology Office Note  Date:  02/25/2016   ID:  Kathleen Thomas, DOB 12-Mar-1945, MRN [id number]  PCP:  Kathleen Pel, MD  Cardiologist: Kathleen Coco MD --> Kathleen Dawley, MD  No chief complaint on file.  History of Present Illness: Kathleen Thomas is a 71 y.o. female who presents for a one-year follow-up visit  She has a history of palpitations and history of a heart murmur.  She is on valsartan for mild hypertension. The patient had an updated echocardiogram on 01/24/12 which again showed an ejection fraction of 55-65% with mild aortic insufficiency and mild mitral regurgitation.  02/17/2015 - the patient was seen by Kathleen Thomas, She has not been expressing any chest pain or shortness of breath. She sleeps on one pillow. She has had no dizziness or syncope. No racing of her heart. Her weight has gone up since last visit.  She has not been aware of any recent PVCs. At her last visit a year Thomas we got a chest x-ray which showed normal heart size and no evidence of CHF.  02/25/2016 - this is the first visit with me, she was seen by Kathleen Thomas. She underwent a TTE on 02/16/2016 and it showed normal LVEF, and her mitral and aortic regurgitation remain in the mild range.  She states that she has been feeling great. She walks every day and works in her backyard without any chest pain shortness of breath palpitations or syncope. She denies any claudication. She was offered statin in the past but really doesn't want a tetanus she is worrying about potential side effects. She brings labs from her primary care physician with normal CBC, urinalysis, normal creatinine and LFTs, HDL 70 triglycerides 89 and LDL of 162. Her only concern is that she feels bruit-like sound in her left ear.   Past Medical History:  Diagnosis Date  . Aortic insufficiency    mild  . Mitral regurgitation    mild   Past Surgical History:  Procedure Laterality Date  . childbirth     x2  .  TUBAL LIGATION     Current Outpatient Prescriptions  Medication Sig Dispense Refill  . CALCIUM PO Take by mouth daily.      . Cholecalciferol (VITAMIN D-3 PO) Take 2,000 mg by mouth daily.     . Multiple Vitamin (MULTIVITAMIN) tablet Take 1 tablet by mouth daily.      . valsartan (DIOVAN) 160 MG tablet Take 160 mg by mouth daily.  4   No current facility-administered medications for this visit.    Allergies:   Patient has no known allergies.   Social History:  The patient  reports that she has never smoked. She does not have any smokeless tobacco history on file.   Family History:  The patient's family history is not on file.   ROS:  Please see the history of present illness.   Otherwise, review of systems are positive for none.   All other systems are reviewed and negative.   PHYSICAL EXAM: VS:  BP 126/64   Pulse 69   Ht 5' 6.5" (1.689 m)   Wt 146 lb (66.2 kg)   BMI 23.21 kg/m  , BMI Body mass index is 23.21 kg/m. GEN: Well nourished, well developed, in no acute distress  HEENT: normal  Neck: no JVD, carotid bruits, or masses Cardiac: RRR; there is a soft apical systolic murmur.  I do not hear any aortic insufficiency murmur today Respiratory:  clear  to auscultation bilaterally, normal work of breathing GI: soft, nontender, nondistended, + BS MS: no deformity or atrophy  Skin: warm and dry, no rash Neuro:  Strength and sensation are intact Psych: euthymic mood, full affect  EKG:  EKG is ordered today. The ekg ordered today demonstrates normal sinus rhythm.  Within normal limits.  Recent Labs: No results found for requested labs within last 8760 hours.   Lipid Panel No results found for: CHOL, TRIG, HDL, CHOLHDL, VLDL, LDLCALC, LDLDIRECT   Wt Readings from Last 3 Encounters:  02/25/16 146 lb (66.2 kg)  02/28/15 147 lb (66.7 kg)  02/07/14 146 lb 6.4 oz (66.4 kg)    TTE: 02/16/2016  - Left ventricle: The cavity size was normal. Wall thickness was   increased in a  pattern of mild LVH. Systolic function was normal.   The estimated ejection fraction was in the range of 55% to 60%.   Left ventricular diastolic function parameters were normal. - Aortic valve: There was mild regurgitation. - Mitral valve: There was mild regurgitation. - Atrial septum: No defect or patent foramen ovale was identified.    ASSESSMENT AND PLAN:  1. Mild mitral regurgitation - stable since 2013 2. Mild aortic insufficiency - stable since 2013 3. Past history of PVCs, improved with reducing caffeine in diet. 4. Hyperlipidemia  The page and is doing great she is encouraged to continue exercising on a daily basis. She has multiple questions regarding her supplements she is encouraged to continue taking calcium and vitamin D. She is also advised to start taking red yeast rice 600 mg by mouth daily.   Current medicines are reviewed at length with the patient today.  The patient does not have concerns regarding medicines.  The following changes have been made:  no change  Labs/ tests ordered today include:   No orders of the defined types were placed in this encounter.  Follow-up in one year.  Signed, Kathleen Dawley, MD 02/25/2016 9:39 AM    Kathleen Thomas, [place], [place]  [address] Phone: [phone]; Fax: [phone]

## 2016-02-25 ENCOUNTER — Other Ambulatory Visit: Payer: Self-pay | Admitting: Cardiology

## 2016-02-25 ENCOUNTER — Ambulatory Visit (INDEPENDENT_AMBULATORY_CARE_PROVIDER_SITE_OTHER): Payer: No Typology Code available for payment source | Admitting: Cardiology

## 2016-02-25 ENCOUNTER — Encounter (INDEPENDENT_AMBULATORY_CARE_PROVIDER_SITE_OTHER): Payer: Self-pay

## 2016-02-25 VITALS — BP 126/64 | HR 69 | Ht 66.5 in | Wt 146.0 lb

## 2016-02-25 DIAGNOSIS — R0989 Other specified symptoms and signs involving the circulatory and respiratory systems: Secondary | ICD-10-CM

## 2016-02-25 DIAGNOSIS — I351 Nonrheumatic aortic (valve) insufficiency: Secondary | ICD-10-CM

## 2016-02-25 DIAGNOSIS — I340 Nonrheumatic mitral (valve) insufficiency: Secondary | ICD-10-CM | POA: Diagnosis not present

## 2016-02-25 DIAGNOSIS — I10 Essential (primary) hypertension: Secondary | ICD-10-CM

## 2016-02-25 DIAGNOSIS — E7800 Pure hypercholesterolemia, unspecified: Secondary | ICD-10-CM

## 2016-02-25 DIAGNOSIS — I493 Ventricular premature depolarization: Secondary | ICD-10-CM | POA: Diagnosis not present

## 2016-02-25 DIAGNOSIS — E785 Hyperlipidemia, unspecified: Secondary | ICD-10-CM

## 2016-02-25 MED ORDER — RED YEAST RICE EXTRACT 600 MG PO CAPS: 600.0000 mg | ORAL_CAPSULE | Freq: Every day | ORAL | 0 refills | Status: DC

## 2016-02-25 NOTE — Patient Instructions (Signed)
Your physician has recommended you make the following change in your medication:  1.) START RED YEAST RICE 600 MG ONCE DAILY  Your physician has requested that you have a carotid duplex. This test is an ultrasound of the carotid arteries in your neck. It looks at blood flow through these arteries that supply the brain with blood. Allow one hour for this exam. There are no restrictions or special instructions.  Your physician wants you to follow-up in: [place]. You will receive a reminder letter in the mail two months in advance. If you don't receive a letter, please call our office to schedule the follow-up appointment.

## 2016-03-01 ENCOUNTER — Ambulatory Visit: Payer: No Typology Code available for payment source

## 2016-03-05 ENCOUNTER — Encounter (HOSPITAL_COMMUNITY): Payer: No Typology Code available for payment source

## 2016-03-08 ENCOUNTER — Ambulatory Visit (HOSPITAL_COMMUNITY)
Admission: RE | Admit: 2016-03-08 | Discharge: 2016-03-08 | Disposition: A | Discharge status: Home / Self Care | Payer: No Typology Code available for payment source | Source: Ambulatory Visit | Attending: Cardiology | Admitting: Cardiology

## 2016-03-08 DIAGNOSIS — R0989 Other specified symptoms and signs involving the circulatory and respiratory systems: Secondary | ICD-10-CM | POA: Diagnosis present

## 2016-03-08 DIAGNOSIS — I6523 Occlusion and stenosis of bilateral carotid arteries: Secondary | ICD-10-CM | POA: Insufficient documentation

## 2017-04-08 ENCOUNTER — Ambulatory Visit (INDEPENDENT_AMBULATORY_CARE_PROVIDER_SITE_OTHER): Payer: No Typology Code available for payment source | Admitting: Cardiology

## 2017-04-08 ENCOUNTER — Encounter: Payer: Self-pay | Admitting: Cardiology

## 2017-04-08 VITALS — BP 114/80 | HR 62 | Ht 66.5 in | Wt 144.0 lb

## 2017-04-08 DIAGNOSIS — E785 Hyperlipidemia, unspecified: Secondary | ICD-10-CM | POA: Diagnosis not present

## 2017-04-08 DIAGNOSIS — I351 Nonrheumatic aortic (valve) insufficiency: Secondary | ICD-10-CM

## 2017-04-08 DIAGNOSIS — I493 Ventricular premature depolarization: Secondary | ICD-10-CM | POA: Diagnosis not present

## 2017-04-08 DIAGNOSIS — I10 Essential (primary) hypertension: Secondary | ICD-10-CM | POA: Diagnosis not present

## 2017-04-08 DIAGNOSIS — I340 Nonrheumatic mitral (valve) insufficiency: Secondary | ICD-10-CM | POA: Diagnosis not present

## 2017-04-08 DIAGNOSIS — H902 Conductive hearing loss, unspecified: Secondary | ICD-10-CM | POA: Insufficient documentation

## 2017-04-08 DIAGNOSIS — H6123 Impacted cerumen, bilateral: Secondary | ICD-10-CM | POA: Insufficient documentation

## 2017-04-08 NOTE — Patient Instructions (Signed)

## 2017-04-08 NOTE — Progress Notes (Addendum)
Cardiology Office Note  Date:  04/08/2017   ID:  WYOMA GENSON, DOB 1945-12-04, MRN [id number]  PCP:  Deland Pretty, MD  Cardiologist: Darlin Coco MD --> Ena Dawley, MD  No chief complaint on file.  History of Present Illness: Kathleen Thomas is a 72 y.o. female who presents for a one-year follow-up visit  She has a history of palpitations and history of a heart murmur.  She is on valsartan for mild hypertension. The patient had an updated echocardiogram on 01/24/12 which again showed an ejection fraction of 55-65% with mild aortic insufficiency and mild mitral regurgitation.  02/17/2015 - the patient was seen by DR Mare Ferrari, She has not been expressing any chest pain or shortness of breath. She sleeps on one pillow. She has had no dizziness or syncope. No racing of her heart. Her weight has gone up since last visit.  She has not been aware of any recent PVCs. At her last visit a year ago we got a chest x-ray which showed normal heart size and no evidence of CHF.  02/25/2016 - this is the first visit with me, she was seen by Dr Mare Ferrari a year ago. She underwent a TTE on 02/16/2016 and it showed normal LVEF, and her mitral and aortic regurgitation remain in the mild range.  She states that she has been feeling great. She walks every day and works in her backyard without any chest pain shortness of breath palpitations or syncope. She denies any claudication. She was offered statin in the past but really doesn't want a tetanus she is worrying about potential side effects. She brings labs from her primary care physician with normal CBC, urinalysis, normal creatinine and LFTs, HDL 70 triglycerides 89 and LDL of 162. Her only concern is that she feels bruit-like sound in her left ear.  04/08/2017 - one year follow-up, the patient feels and looks great, she continues to go to gym 3 times a week doing aerobic classes and denies any chest pain or shortness of breath. She states that the  more she does the better she feels, she has started to take a red [place] rise for hyperlipidemia and she tolerates it well. She denies any chest pain, shortness of breath no palpitations claudications no lower extremity edema.   Past Medical History:  Diagnosis Date  . Aortic insufficiency    mild  . Mitral regurgitation    mild   Past Surgical History:  Procedure Laterality Date  . childbirth     x2  . TUBAL LIGATION     Current Outpatient Medications  Medication Sig Dispense Refill  . CALCIUM-MAGNESIUM-ZINC PO Take by mouth daily.    . Cholecalciferol (VITAMIN D-3 PO) Take 2,000 mg by mouth daily.     . irbesartan (AVAPRO) 150 MG tablet Take 150 mg by mouth daily.  4  . Red Yeast Rice Extract 600 MG CAPS Take 1 capsule (600 mg total) by mouth daily.  0   No current facility-administered medications for this visit.    Allergies:   Patient has no known allergies.   Social History:  The patient  reports that  has never smoked. she has never used smokeless tobacco.   Family History:  The patient's family history is not on file.   ROS:  Please see the history of present illness.   Otherwise, review of systems are positive for none.   All other systems are reviewed and negative.   PHYSICAL EXAM: VS:  BP 114/80 (  BP Location: Left Arm, Patient Position: Sitting, Cuff Size: Normal)   Pulse 62   Ht 5' 6.5" (1.689 m)   Wt 144 lb (65.3 kg)   SpO2 98%   BMI 22.89 kg/m  , BMI Body mass index is 22.89 kg/m. GEN: Well nourished, well developed, in no acute distress  HEENT: normal  Neck: no JVD, carotid bruits, or masses Cardiac: RRR; there is a soft apical systolic murmur.  I do not hear any aortic insufficiency murmur today Respiratory:  clear to auscultation bilaterally, normal work of breathing GI: soft, nontender, nondistended, + BS MS: no deformity or atrophy  Skin: warm and dry, no rash Neuro:  Strength and sensation are intact Psych: euthymic mood, full affect  EKG:  EKG  is ordered today. The ekg ordered today demonstrates normal sinus rhythm.  Within normal limits.  Recent Labs: No results found for requested labs within last 8760 hours.   Lipid Panel No results found for: CHOL, TRIG, HDL, CHOLHDL, VLDL, LDLCALC, LDLDIRECT   Wt Readings from Last 3 Encounters:  04/08/17 144 lb (65.3 kg)  02/25/16 146 lb (66.2 kg)  02/28/15 147 lb (66.7 kg)    TTE: 02/16/2016  - Left ventricle: The cavity size was normal. Wall thickness was   increased in a pattern of mild LVH. Systolic function was normal.   The estimated ejection fraction was in the range of 55% to 60%.   Left ventricular diastolic function parameters were normal. - Aortic valve: There was mild regurgitation. - Mitral valve: There was mild regurgitation. - Atrial septum: No defect or patent foramen ovale was identified.  EKG today shows normal sinus rhythm, nonspecific ST-T wave abnormalities, unchanged from prior. This was personally reviewed.  ASSESSMENT AND PLAN:  1. Mild mitral regurgitation - stable since 2013 2. Mild aortic insufficiency - stable since 2013 3. Past history of PVCs, improved with reducing caffeine in diet. 4.  Hyperlipidemia  The patient looks great, she is completely asymptomatic, will continue same medical management, we'll obtain lipids from her primary care physician, she is adamant about not taking statins but she is tolerating Red yeast rice. This is probably okay given she doesn't have known coronary artery disease and she is very active.  I have obtained her lipids from PCP: LDL 133, TG 94, HDL 64  Labs/ tests ordered today include:   Orders Placed This Encounter  Procedures  . EKG 12-Lead   Follow-up in one year.  Signed, Ena Dawley, MD 04/08/2017 9:55 AM    [facility] [address], [place], [place]  [address] Phone: [phone]; Fax: [phone]

## 2018-03-28 ENCOUNTER — Encounter: Payer: Self-pay | Admitting: Internal Medicine

## 2018-04-25 ENCOUNTER — Ambulatory Visit: Payer: No Typology Code available for payment source | Admitting: Internal Medicine

## 2018-05-11 ENCOUNTER — Telehealth: Payer: Self-pay | Admitting: *Deleted

## 2018-05-11 NOTE — Telephone Encounter (Signed)
Virtual Visit Pre-Appointment Phone Call  Steps For Call:  1. Confirm consent - "In the setting of the current Covid19 crisis, you are scheduled for a ( video) visit with your [name] on 05/24/18 at 8am .  Just as we do with many in-office visits, in order for you to participate in this visit, we must obtain consent.  If you'd like, I can send this to your mychart (if signed up) or email for you to review.  Otherwise, I can obtain your verbal consent now.  All virtual visits are billed to your insurance company just like a normal visit would be.  By agreeing to a virtual visit, we'd like you to understand that the technology does not allow for your provider to perform an examination, and thus may limit your provider's ability to fully assess your condition.  Finally, though the technology is pretty good, we cannot assure that it will always work on either your or our end, and in the setting of a video visit, we may have to convert it to a phone-only visit.  In either situation, we cannot ensure that we have a secure connection.  Are you willing to proceed?" pt replied yes   2. Advise patient to be prepared with any vital sign or heart rhythm information, their current medicines, and a piece of paper and pen handy for any instructions they may receive the day of their visit  3. Inform patient they will receive a phone call 15 minutes prior to their appointment time (may be from unknown caller ID) so they should be prepared to answer  4. Confirm that appointment type is correct in Epic appointment notes (video visit through doximity with [name] on 4/22 at 8 am)    TELEPHONE CALL NOTE  Kathleen Thomas has been deemed a candidate for a follow-up tele-health visit to limit community exposure during the Covid-19 pandemic. I spoke with the patient via phone to ensure availability of phone/video source, confirm preferred email & phone number, and discuss instructions and expectations.  I reminded  Kathleen Thomas to be prepared with any vital sign and/or heart rhythm information that could potentially be obtained via home monitoring, at the time of her visit. I reminded Kathleen Thomas to expect a phone call at the time of her visit if her visit.  Did the patient verbally acknowledge consent to treatment? PT GAVE VERBAL CONSENT FOR VIDEO VISIT WITH DR Meda Coffee ON 4/22 AT 8 AM AND I ALSO SENT HER A LINK TO HER EMAIL ADDRESS TO ASSIST WITH ACTIVATING HER MYCHART ACCOUNT. INFORMED THE PT ONCE HER MYCHART IS ACTIVATED, WE CAN ALSO SEND INSTRUCTIONS/CONSENT TO HER THAT WAY AS WELL.   Kathleen Alpha, LPN 07/03/8313 1:76 PM     CONSENT FOR TELE-HEALTH VISIT - PLEASE REVIEW  I hereby voluntarily request, consent and authorize [facility] and its employed or contracted physicians, physician assistants, nurse practitioners or other licensed health care professionals (the Practitioner), to provide me with telemedicine health care services (the "Services") as deemed necessary by the treating Practitioner. I acknowledge and consent to receive the Services by the Practitioner via telemedicine. I understand that the telemedicine visit will involve communicating with the Practitioner through live audiovisual communication technology and the disclosure of certain medical information by electronic transmission. I acknowledge that I have been given the opportunity to request an in-person assessment or other available alternative prior to the telemedicine visit and am voluntarily participating in the telemedicine visit.  I understand that I have the right to withhold or withdraw my consent to the use of telemedicine in the course of my care at any time, without affecting my right to future care or treatment, and that the Practitioner or I may terminate the telemedicine visit at any time. I understand that I have the right to inspect all information obtained and/or recorded in the course of the telemedicine visit and  may receive copies of available information for a reasonable fee.  I understand that some of the potential risks of receiving the Services via telemedicine include:  Marland Kitchen Delay or interruption in medical evaluation due to technological equipment failure or disruption; . Information transmitted may not be sufficient (e.g. poor resolution of images) to allow for appropriate medical decision making by the Practitioner; and/or  . In rare instances, security protocols could fail, causing a breach of personal health information.  Furthermore, I acknowledge that it is my responsibility to provide information about my medical history, conditions and care that is complete and accurate to the best of my ability. I acknowledge that Practitioner's advice, recommendations, and/or decision may be based on factors not within their control, such as incomplete or inaccurate data provided by me or distortions of diagnostic images or specimens that may result from electronic transmissions. I understand that the practice of medicine is not an exact science and that Practitioner makes no warranties or guarantees regarding treatment outcomes. I acknowledge that I will receive a copy of this consent concurrently upon execution via email to the email address I last provided but may also request a printed copy by calling the office of [place] [facility].    I understand that my insurance will be billed for this visit.   I have read or had this consent read to me. . I understand the contents of this consent, which adequately explains the benefits and risks of the Services being provided via telemedicine.  . I have been provided ample opportunity to ask questions regarding this consent and the Services and have had my questions answered to my satisfaction. . I give my informed consent for the services to be provided through the use of telemedicine in my medical care  By participating in this telemedicine visit I agree to the above.   PT AGREED TO OVER THE PHONE TO BE TREATED BY DR NELSON VIA VIDEO VISIT ON 4/22. SHE IS WORKING ON ACTIVATING HER MYCHART.

## 2018-05-18 NOTE — Telephone Encounter (Signed)
Pt called to inform her that her 8 am appt with Dr Meda Coffee on 4/22 needs to be scheduled at a later time that day at 10:20 am, per Dr Meda Coffee having a meeting.  Pt was ok with time change and gracious for the call back.

## 2018-05-24 ENCOUNTER — Telehealth (INDEPENDENT_AMBULATORY_CARE_PROVIDER_SITE_OTHER): Payer: No Typology Code available for payment source | Admitting: Cardiology

## 2018-05-24 ENCOUNTER — Encounter: Payer: Self-pay | Admitting: *Deleted

## 2018-05-24 ENCOUNTER — Other Ambulatory Visit: Payer: Self-pay

## 2018-05-24 ENCOUNTER — Encounter: Payer: Self-pay | Admitting: Cardiology

## 2018-05-24 DIAGNOSIS — I10 Essential (primary) hypertension: Secondary | ICD-10-CM

## 2018-05-24 DIAGNOSIS — I351 Nonrheumatic aortic (valve) insufficiency: Secondary | ICD-10-CM

## 2018-05-24 DIAGNOSIS — I340 Nonrheumatic mitral (valve) insufficiency: Secondary | ICD-10-CM | POA: Diagnosis not present

## 2018-05-24 DIAGNOSIS — I493 Ventricular premature depolarization: Secondary | ICD-10-CM

## 2018-05-24 DIAGNOSIS — E785 Hyperlipidemia, unspecified: Secondary | ICD-10-CM

## 2018-05-24 DIAGNOSIS — R0989 Other specified symptoms and signs involving the circulatory and respiratory systems: Secondary | ICD-10-CM

## 2018-05-24 MED ORDER — IRBESARTAN 150 MG PO TABS: 150.0000 mg | ORAL_TABLET | Freq: Every day | ORAL | 2 refills | Status: DC

## 2018-05-24 MED ORDER — RED YEAST RICE EXTRACT 600 MG PO CAPS: 600.0000 mg | ORAL_CAPSULE | Freq: Every day | ORAL | 3 refills | Status: DC

## 2018-05-24 NOTE — Patient Instructions (Signed)
Medication Instructions:   Your physician recommends that you continue on your current medications as directed. Please refer to the Current Medication list given to you today.  If you need a refill on your cardiac medications before your next appointment, please call your pharmacy.    Lab work:  WE HAVE REQUESTED YOUR MOST RECENT LABS TO BE FAXED TO OUR AND SCANNED INTO YOUR CHART FOR [name] TO REVIEW.  If you have labs (blood work) drawn today and your tests are completely normal, you will receive your results only by: Marland Kitchen MyChart Message (if you have MyChart) OR . A paper copy in the mail If you have any lab test that is abnormal or we need to change your treatment, we will call you to review the results.    Follow-Up: At [facility], you and your health needs are our priority.  As part of our continuing mission to provide you with exceptional heart care, we have created designated Provider Care Teams.  These Care Teams include your primary Cardiologist (physician) and Advanced Practice Providers (APPs -  Physician Assistants and Nurse Practitioners) who all work together to provide you with the care you need, when you need it.  Your physician wants you to follow-up in: [place] will receive a reminder letter in the mail two months in advance. If you don't receive a letter, please call our office to schedule the follow-up appointment.

## 2018-05-24 NOTE — Progress Notes (Signed)
Virtual Visit via Video Note   This visit type was conducted due to national recommendations for restrictions regarding the COVID-19 Pandemic (e.g. social distancing) in an effort to limit this patient's exposure and mitigate transmission in our community.  Due to her co-morbid illnesses, this patient is at least at moderate risk for complications without adequate follow up.  This format is felt to be most appropriate for this patient at this time.  All issues noted in this document were discussed and addressed.  A limited physical exam was performed with this format.  Please refer to the patient's chart for her consent to telehealth for [facility].   Evaluation Performed:  Follow-up visit  Date:  05/24/2018   ID:  Kathleen Thomas, DOB 1946-01-13, MRN [id number]  Patient Location: Home Provider Location: Home  PCP:  Deland Pretty, MD  Cardiologist: Dr Meda Coffee  Chief Complaint:  Cold feet, 1 year follow up  History of Present Illness:    Kathleen Thomas is a 73 y.o. female with history of palpitations, hypertenion and history of a heart murmur. Echocardiogram in 2018 which again showed an ejection fraction of 55-65% with mild aortic insufficiency and mild mitral regurgitation.   The patient is doing well, she walks and works in the garden and has no CP, SOB, palpitation. No claudications, no dizziness or syncope.  She has noticed cold feet but no claudications or bluish discoloration.  The patient does not have symptoms concerning for COVID-19 infection (fever, chills, cough, or new shortness of breath).   Past Medical History:  Diagnosis Date  . Anemia   . Aortic insufficiency    mild  . Cherry angioma   . Depression    recurrent  . Dermatitis   . Hallux valgus (acquired), left foot    with bunions  . Hammer toes of both feet   . Helicobacter pylori infection   . Hyperlipidemia   . Hypertension   . Leukopenia    mild  . Mitral regurgitation    mild  . Osteopenia    . Osteoporosis   . Scoliosis   . Seborrheic keratoses   . Vitamin D deficiency    Past Surgical History:  Procedure Laterality Date  . childbirth     x2  . TUBAL LIGATION       Current Meds  Medication Sig  . Cholecalciferol (VITAMIN D-3 PO) Take 2,000 mg by mouth daily.   . irbesartan (AVAPRO) 150 MG tablet Take 150 mg by mouth daily.   Marland Kitchen MAGNESIUM PO Take 1 capsule by mouth daily.  . MULTIPLE VITAMIN PO Take by mouth.  . Red Yeast Rice Extract 600 MG CAPS Take 1 capsule (600 mg total) by mouth daily.  . sertraline (ZOLOFT) 25 MG tablet Take 25 mg by mouth daily.     Allergies:   Actonel [risedronate sodium] and Fosamax [alendronate sodium]   Social History   Tobacco Use  . Smoking status: Never Smoker  . Smokeless tobacco: Never Used  Substance Use Topics  . Alcohol use: Never    Frequency: Never  . Drug use: Not on file    Family Hx: The patient's family history includes Atrial fibrillation in her mother; Congestive Heart Failure in her father and mother; Epilepsy in her son.  ROS:   Please see the history of present illness.    All other systems reviewed and are negative.  Prior CV studies:   The following studies were reviewed today:  Labs/Other Tests and Data  Reviewed:    EKG:  No ECG reviewed.  Recent Labs: No results found for requested labs within last 8760 hours.   Recent Lipid Panel No results found for: CHOL, TRIG, HDL, CHOLHDL, LDLCALC, LDLDIRECT  Wt Readings from Last 3 Encounters:  05/24/18 135 lb (61.2 kg)  04/08/17 144 lb (65.3 kg)  02/25/16 146 lb (66.2 kg)    Objective:    Vital Signs:  BP (!) 113/57   Pulse 67   Ht 5' 6.5" (1.689 m)   Wt 135 lb (61.2 kg)   BMI 21.46 kg/m    VITAL SIGNS:  reviewed   ASSESSMENT & PLAN:    1. Mild mitral regurgitation - stable since 2013 2. Mild aortic insufficiency - stable since 2013 3. Past history of PVCs, improved, she is asymptomatic now. 4.  Hyperlipidemia - on red yeast rice, no  known CAD. We will obtain labs from her PCP. 5. Hypertension - well controlled, continue irbesartan.   The patient looks great, she is completely asymptomatic, will continue same medical management, we'll obtain lipids from her primary care physician, she is adamant about not taking statins but she is tolerating Red yeast rice. This is probably okay given she doesn't have known coronary artery disease and she is very active.  I have obtained her lipids from PCP: LDL 133, TG 94, HDL 64  COVID-19 Education: The signs and symptoms of COVID-19 were discussed with the patient and how to seek care for testing (follow up with PCP or arrange E-visit).  The importance of social distancing was discussed today.  Time:   Today, I have spent 15 minutes with the patient with telehealth technology discussing the above problems.    Medication Adjustments/Labs and Tests Ordered: Current medicines are reviewed at length with the patient today.  Concerns regarding medicines are outlined above.   Tests Ordered: No orders of the defined types were placed in this encounter.  Medication Changes: No orders of the defined types were placed in this encounter.  Disposition:  Follow up in 6 month(s)  Signed, Ena Dawley, MD  05/24/2018 10:12 AM    [place]

## 2018-05-24 NOTE — Addendum Note (Signed)
Addended by: [name] on: 05/24/2018 11:07 AM   Modules accepted: Orders

## 2018-05-25 ENCOUNTER — Ambulatory Visit: Payer: No Typology Code available for payment source | Admitting: Cardiology

## 2018-05-30 ENCOUNTER — Encounter: Payer: Self-pay | Admitting: General Surgery

## 2018-05-30 ENCOUNTER — Telehealth: Payer: Self-pay | Admitting: Internal Medicine

## 2018-05-30 NOTE — Telephone Encounter (Signed)
Pt called back for prescreening.

## 2018-05-31 ENCOUNTER — Encounter: Payer: Self-pay | Admitting: Internal Medicine

## 2018-05-31 ENCOUNTER — Other Ambulatory Visit: Payer: Self-pay

## 2018-05-31 ENCOUNTER — Ambulatory Visit (INDEPENDENT_AMBULATORY_CARE_PROVIDER_SITE_OTHER): Payer: No Typology Code available for payment source | Admitting: Internal Medicine

## 2018-05-31 VITALS — Ht 66.5 in | Wt 135.0 lb

## 2018-05-31 DIAGNOSIS — R1115 Cyclical vomiting syndrome unrelated to migraine: Secondary | ICD-10-CM | POA: Diagnosis not present

## 2018-05-31 NOTE — Patient Instructions (Signed)
It was good to meet you over the phone today.  I am glad to hear that the recurrent vomiting episodes have stopped.  As we discussed I am not sure what was causing those but since your labs are okay, you have not had unintentional weight loss, bleeding or other worrisome features I think observing things and if the vomiting episodes occur again let me know and I would likely set you up for some other testing.  This could include an upper endoscopy test or perhaps a CT scan.  I appreciate the opportunity to care for you. Gatha Mayer, MD, [facility]

## 2018-05-31 NOTE — Progress Notes (Signed)
TELEHEALTH ENCOUNTER IN SETTING OF COVID-19 PANDEMIC - REQUESTED BY PATIENT SERVICE PROVIDED BY TELEMEDECINE - TYPE: telephone PATIENT LOCATION: Home PATIENT HAS CONSENTED TO TELEHEALTH VISIT PROVIDER LOCATION: OFFICE REFERRING PROVIDER:Dr. Shelia Media PARTICIPANTS OTHER THAN PATIENT:None TIME SPENT ON CALL: 10 mins    Kathleen Thomas 73 y.o. 1945/11/10 [id number]  Assessment & Plan:   Encounter Diagnosis  Name Primary?  . Persistent vomiting-currently resolved Yes    Hard to know what these episodes were.  Does not seem like it was a vagal reaction which could happen with defecation, based upon the history.  Fortunately they have stopped and there are no worrisome features.  Her weight is currently stable this year though she did lose from year to year it has leveled at this point and the symptoms have stopped.  Given that we have decided to observe and if they recur she is to contact me at which point would consider upper endoscopy or CT scanning or both depending upon the history at that time.  I appreciate the opportunity to care for this patient. CC: Deland Pretty, MD   Subjective:   Chief Complaint: Vomiting  HPI The patient is seen through telehealth, this was a telephone visit, because of persistent vomiting issues that occurred over the past year.  She thinks about 8 episodes of vomiting that would occur during defecation.  She would be moving her bowels and get nauseous and then I have to also throw up.  It would often be small amounts of the food that she had just eaten.  There was no unintentional weight loss, she had prescribed this to Dr. Shelia Media her primary care physician at a visit in March 02, 2018 and she is not have problems since.  No associated headaches, no sweats flushing or anything to suggest a vagal reaction that I can tell.  She had never had it before.  It would occur about 2 to 3 minutes after defecating.  It was not predictable and it was typically after  breakfast.  When she saw Dr.Pharr apparently she had lost about 5 pounds but the patient says this is stabilized.  Wt Readings from Last 3 Encounters:  05/31/18 135 lb (61.2 kg)  05/30/18 135 lb (61.2 kg)  05/24/18 135 lb (61.2 kg)  February 20 2134 pounds at Dr. Pennie Banter office    Wt Readings from Last 3 Encounters:  04/08/17 144 lb (65.3 kg)  02/25/16 146 lb (66.2 kg)  02/28/15 147 lb (66.7 kg)   Labs reviewed from February 22, 2018 show a normal CMET, cholesterol 221 HDL 69 normal triglycerides LDL 142.  The urinalysis was negative vitamin D normal complete blood count normal other than slightly low white count at 4.2  Cologuard -2019 was negative Allergies  Allergen Reactions  . Actonel [Risedronate Sodium] Other (See Comments)    Flu like symptoms  . Fosamax [Alendronate Sodium] Other (See Comments)    Abdominal pain   Current Meds  Medication Sig  . Cholecalciferol (VITAMIN D-3 PO) Take 2,000 mg by mouth daily.   . irbesartan (AVAPRO) 150 MG tablet Take 1 tablet (150 mg total) by mouth daily.  Marland Kitchen MAGNESIUM PO Take 1 capsule by mouth daily.  . MULTIPLE VITAMIN PO Take by mouth.  . Red Yeast Rice Extract 600 MG CAPS Take 1 capsule (600 mg total) by mouth daily.  . sertraline (ZOLOFT) 25 MG tablet Take 25 mg by mouth daily.   Past Medical History:  Diagnosis Date  . Anemia   .  Aortic insufficiency    mild  . Cherry angioma   . Depression    recurrent  . Dermatitis   . Hallux valgus (acquired), left foot    with bunions  . Hammer toes of both feet   . Helicobacter pylori infection   . Hyperlipidemia   . Hypertension   . Leukopenia    mild  . Mitral regurgitation    mild  . Osteopenia   . Osteoporosis   . Scoliosis   . Seborrheic keratoses   . Vitamin D deficiency    Past Surgical History:  Procedure Laterality Date  . childbirth     x2  . TUBAL LIGATION     Social History   Social History Narrative   The patient is married she does have 1 son who lives  with her, he has a seizure disorder   Her husband lives in a group home due to bipolar disorder   Alcohol, tobacco or drug use all negative   family history includes Atrial fibrillation in her mother; Congestive Heart Failure in her father and mother; Epilepsy in her son.   Review of Systems See HPI.  All other review of systems appears negative at this time.  Data reviewed includes primary care note from January, labs from January, labs and cardiology notes in the EMR

## 2018-09-13 ENCOUNTER — Other Ambulatory Visit: Payer: Self-pay

## 2018-09-13 DIAGNOSIS — Z20822 Contact with and (suspected) exposure to covid-19: Secondary | ICD-10-CM

## 2018-09-15 LAB — NOVEL CORONAVIRUS, NAA: SARS-CoV-2, NAA: NOT DETECTED

## 2018-10-23 ENCOUNTER — Telehealth: Payer: Self-pay | Admitting: Cardiology

## 2018-10-23 NOTE — Telephone Encounter (Signed)
Called the pt and informed her that Dr Meda Coffee was perfectly ok with her coming in April 2021 for her yearly visit. Recall placed. Pt verbalized understanding and agrees with this plan.

## 2018-10-23 NOTE — Telephone Encounter (Signed)
1 years perfectly fine thank you

## 2018-10-23 NOTE — Telephone Encounter (Signed)
Pt states she had a 1 year  televisit with Dr Meda Coffee back in 05/2018 and it was advised for her to follow-up in our clinic in 6 months.  Pt states she usually has 1 year visits with [name], and would like to come back in around that time next year, if its ok with [name]? Pt states she feels great and having no cardiac complications.  Informed the pt this will probably be fine and I will run this by [name] to make sure she is ok pushing her 6 month follow-up to a 1 year follow-up. Informed the pt that if she wants her to come in sooner, I will let her know thereafter. Pt verbalized understanding and agrees with this plan.

## 2018-10-23 NOTE — Telephone Encounter (Signed)
New Message    Patient wants to know if she needs recall appointment or can she wait until next year to come in.  Please call and advise.

## 2019-03-03 ENCOUNTER — Other Ambulatory Visit: Payer: Self-pay | Admitting: Cardiology

## 2019-03-16 ENCOUNTER — Telehealth: Payer: Self-pay

## 2019-03-16 NOTE — Telephone Encounter (Signed)
NOTES ON FILE FROM [facility] [phone], SENT REFERRAL TO SCHEDULING 

## 2019-03-16 NOTE — Telephone Encounter (Signed)
ERROR ON LAST NOTE NOT A REFERRAL JUST NOTES FOR DR Meda Coffee

## 2019-03-18 ENCOUNTER — Ambulatory Visit: Payer: No Typology Code available for payment source | Attending: Internal Medicine

## 2019-03-18 DIAGNOSIS — Z23 Encounter for immunization: Secondary | ICD-10-CM | POA: Insufficient documentation

## 2019-03-18 NOTE — Progress Notes (Signed)
   Covid-19 Vaccination Clinic  Name:  LAQUEENA KOSAKA    MRN: [id number] DOB: [birth year]  03/18/2019  Ms. Seher was observed post Covid-19 immunization for 15 minutes without incidence. She was provided with Vaccine Information Sheet and instruction to access the V-Safe system.   Ms. Carlock was instructed to call 911 with any severe reactions post vaccine: Marland Kitchen Difficulty breathing  . Swelling of your face and throat  . A fast heartbeat  . A bad rash all over your body  . Dizziness and weakness    Immunizations Administered    Name Date Dose VIS Date Route   Pfizer COVID-19 Vaccine 03/18/2019  2:44 PM 0.3 mL 01/12/2019 Intramuscular   Manufacturer: [place]   Lot: [id number]   [place]: [id number]

## 2019-03-26 ENCOUNTER — Other Ambulatory Visit: Payer: Self-pay | Admitting: Internal Medicine

## 2019-03-26 DIAGNOSIS — Z1231 Encounter for screening mammogram for malignant neoplasm of breast: Secondary | ICD-10-CM

## 2019-04-10 ENCOUNTER — Ambulatory Visit: Payer: No Typology Code available for payment source | Attending: Internal Medicine

## 2019-04-10 DIAGNOSIS — Z23 Encounter for immunization: Secondary | ICD-10-CM | POA: Insufficient documentation

## 2019-04-10 NOTE — Progress Notes (Signed)
   Covid-19 Vaccination Clinic  Name:  Kathleen Thomas    MRN: [id number] DOB: [birth year]  04/10/2019  Kathleen Thomas was observed post Covid-19 immunization for 15 minutes without incident. She was provided with Vaccine Information Sheet and instruction to access the V-Safe system.   Kathleen Thomas was instructed to call 911 with any severe reactions post vaccine: Marland Kitchen Difficulty breathing  . Swelling of face and throat  . A fast heartbeat  . A bad rash all over body  . Dizziness and weakness   Immunizations Administered    Name Date Dose VIS Date Route   Pfizer COVID-19 Vaccine 04/10/2019  1:34 PM 0.3 mL 01/12/2019 Intramuscular   Manufacturer: [place]   Lot: [id number]   [place]: [id number]

## 2019-04-11 ENCOUNTER — Encounter: Payer: Self-pay | Admitting: Internal Medicine

## 2019-04-11 ENCOUNTER — Other Ambulatory Visit: Payer: Self-pay

## 2019-04-11 ENCOUNTER — Ambulatory Visit: Payer: No Typology Code available for payment source

## 2019-04-11 ENCOUNTER — Ambulatory Visit (INDEPENDENT_AMBULATORY_CARE_PROVIDER_SITE_OTHER): Payer: No Typology Code available for payment source | Admitting: Internal Medicine

## 2019-04-11 VITALS — BP 120/76 | HR 72 | Temp 98.7°F | Ht 66.5 in | Wt 141.0 lb

## 2019-04-11 DIAGNOSIS — R1115 Cyclical vomiting syndrome unrelated to migraine: Secondary | ICD-10-CM

## 2019-04-11 DIAGNOSIS — R194 Change in bowel habit: Secondary | ICD-10-CM

## 2019-04-11 NOTE — Progress Notes (Signed)
Kathleen Thomas 74 y.o. 1945/03/08 [id number]  Assessment & Plan:   Encounter Diagnoses  Name Primary?  . Persistent vomiting Yes  . Change in bowel habits     Unusual constellation of symptoms where she has vomiting during or just after defecation intermittently and a chronic episodic pattern with periods that are asymptomatic.  At this point our plan is to evaluate for structural abnormalities with an EGD and a colonoscopy.  The risks and benefits as well as alternatives of endoscopic procedure(s) have been discussed and reviewed. All questions answered. The patient agrees to proceed.  Pending that, consider cross-sectional imaging with CT scanning, consider medication to increase the frequency of defecation, consider low-dose anticholinergic medication such as dicyclomine or hyoscyamine or glycopyrrolate.  I have seen patients with abdominal cramping develop vasovagal type symptoms and I wonder if there is not some issue with that though she does not have other symptoms of that.   Note she had second Covid vaccine, [place], yesterday April 10, 2019 A little achy today   I appreciate the opportunity to care for this patient. CC: Deland Pretty, MD     Subjective:   Chief Complaint: Vomiting and nausea  HPI The patient has had a recurrence of nausea and vomiting that occur just after or during defecation.  I had seen her for this via telehealth almost a year ago, and she was better and we elected to observe.  She had the problems again multiple times over the last few months though it has been a sporadic problem over a couple of years she thinks.  She has not lost weight but is troubled by these problems.  She had a negative Cologuard in 2019 she had a colonoscopy last in 2017 by Dr. Collene Mares that was apparently negative.  The episodes are typically having a large bowel movement usually soft may be loose and then she gets nauseous and vomits and vomit some food particles.  In  between she is okay.  She does not have vagal symptoms otherwise i.e. no lightheadedness, sweats and there is no cramping or pain.  She has not been able to related to certain foods or anything like that.  She had laboratory testing with Dr. Shelia Media recently, early February 2021 white count 4.2 which was just slightly low but otherwise normal CBC electrolytes liver tests renal function all normal.  Urinalysis negative also.  Hemoglobin specifically was 12.1.  I have reviewed the office visit note with Dr. Shelia Media 03/13/2019 as well.  Depression screening was negative.  Wt Readings from Last 3 Encounters:  04/11/19 141 lb (64 kg)  05/31/18 135 lb (61.2 kg)  05/30/18 135 lb (61.2 kg)  She relates how she was treated for H. pylori and documented negative with a breath test in the past and wonders if that is part of the issue.  I.e. recurrence?.  She also says she feels "toxic".  Further exploration indicates she feels bloated.  She defecates about 3 times a week which may be less than before but not a great change.  She does also think that she has halitosis.  Allergies  Allergen Reactions  . Actonel [Risedronate Sodium] Other (See Comments)    Flu like symptoms  . Fosamax [Alendronate Sodium] Other (See Comments)    Abdominal pain   Current Meds  Medication Sig  . Cholecalciferol (VITAMIN D-3 PO) Take 2,000 mg by mouth daily.   . irbesartan (AVAPRO) 150 MG tablet Take 1 tablet (150 mg total) by mouth  daily. (Patient taking differently: Take 75 mg by mouth daily. )  . Red Yeast Rice Extract 600 MG CAPS Take 1 capsule (600 mg total) by mouth daily.  . sertraline (ZOLOFT) 25 MG tablet Take 25 mg by mouth daily.   Past Medical History:  Diagnosis Date  . Anemia   . Anxiety   . Aortic insufficiency    mild  . Cherry angioma   . Depression    recurrent  . Dermatitis   . Hallux valgus (acquired), left foot    with bunions  . Hammer toes of both feet   . Helicobacter pylori infection   .  Hyperlipidemia   . Hypertension   . Leukopenia    mild  . Mitral regurgitation    mild  . Osteopenia   . Osteoporosis   . Pure hypercholesterolemia   . Scoliosis   . Seborrheic keratoses   . Vitamin D deficiency    Past Surgical History:  Procedure Laterality Date  . childbirth     x2  . COLONOSCOPY    . TONSILLECTOMY    . TUBAL LIGATION     Social History   Social History Narrative   The patient is married she does have 1 son who lives with her, he has a seizure disorder   Her husband lives in a group home due to bipolar disorder   Alcohol, tobacco or drug use all negative   Retired from [employer]   family history includes Atrial fibrillation in her mother; Congestive Heart Failure in her father and mother; Epilepsy in her son.   Review of Systems As above  Objective:   Physical Exam BP 120/76   Pulse 72   Temp 98.7 F (37.1 C)   Ht 5' 6.5" (1.689 m)   Wt 141 lb (64 kg)   BMI 22.42 kg/m  Well-developed elderly white woman in no acute distress Lungs are clear Heart sounds are normal The abdomen is soft and nontender without organomegaly or mass and bowel sounds are present

## 2019-04-11 NOTE — Patient Instructions (Signed)
You have been scheduled for an endoscopy and colonoscopy. Please follow the written instructions given to you at your visit today. Please pick up your prep supplies at the pharmacy within the next 1-3 days. If you use inhalers (even only as needed), please bring them with you on the day of your procedure.   You may have a light breakfast the morning of prep day (the day before the procedure). May 23, 2019  You may choose from one of the following items: eggs and toast OR chicken noodle soup and crackers.   You should have your breakfast completed between 8:00 and 9:00 am the day before your procedure.    After you have had your light breakfast you should start a clear liquid diet only, NO SOLIDS. No additional solid food is allowed. You may continue to have clear liquid up to 3 hours prior to your procedure.    I appreciate the opportunity to care for you. Silvano Rusk, MD, [facility]

## 2019-04-30 ENCOUNTER — Ambulatory Visit: Payer: No Typology Code available for payment source

## 2019-05-22 ENCOUNTER — Telehealth: Payer: Self-pay | Admitting: Internal Medicine

## 2019-05-22 NOTE — Telephone Encounter (Signed)
Hey Dr. Carlean Purl- this patient Kathleen Thomas has cancelled her Endo/Colon that was scheduled on 4/22. She states that she is not feeling well at all. She did not rescheduled right now- states she wanted to call back.

## 2019-05-22 NOTE — Telephone Encounter (Signed)
OK No charge 

## 2019-05-23 ENCOUNTER — Ambulatory Visit: Payer: No Typology Code available for payment source

## 2019-05-24 ENCOUNTER — Encounter: Payer: No Typology Code available for payment source | Admitting: Internal Medicine

## 2019-05-25 DIAGNOSIS — G8929 Other chronic pain: Secondary | ICD-10-CM | POA: Insufficient documentation

## 2019-05-25 DIAGNOSIS — M25561 Pain in right knee: Secondary | ICD-10-CM | POA: Insufficient documentation

## 2019-06-12 ENCOUNTER — Ambulatory Visit (INDEPENDENT_AMBULATORY_CARE_PROVIDER_SITE_OTHER): Payer: No Typology Code available for payment source | Admitting: Cardiology

## 2019-06-12 ENCOUNTER — Other Ambulatory Visit: Payer: Self-pay

## 2019-06-12 ENCOUNTER — Encounter: Payer: Self-pay | Admitting: Cardiology

## 2019-06-12 VITALS — BP 122/78 | HR 61 | Ht 66.5 in | Wt 141.0 lb

## 2019-06-12 DIAGNOSIS — I493 Ventricular premature depolarization: Secondary | ICD-10-CM

## 2019-06-12 DIAGNOSIS — E785 Hyperlipidemia, unspecified: Secondary | ICD-10-CM

## 2019-06-12 DIAGNOSIS — I351 Nonrheumatic aortic (valve) insufficiency: Secondary | ICD-10-CM

## 2019-06-12 DIAGNOSIS — I10 Essential (primary) hypertension: Secondary | ICD-10-CM

## 2019-06-12 DIAGNOSIS — I340 Nonrheumatic mitral (valve) insufficiency: Secondary | ICD-10-CM | POA: Diagnosis not present

## 2019-06-12 NOTE — Patient Instructions (Signed)
Medication Instructions:   Your physician recommends that you continue on your current medications as directed. Please refer to the Current Medication list given to you today.  *If you need a refill on your cardiac medications before your next appointment, please call your pharmacy*  Testing/Procedures:  Your physician has requested that you have an echocardiogram. Echocardiography is a painless test that uses sound waves to create images of your heart. It provides your doctor with information about the size and shape of your heart and how well your heart's chambers and valves are working. This procedure takes approximately one hour. There are no restrictions for this procedure.   Follow-Up: At [facility], you and your health needs are our priority.  As part of our continuing mission to provide you with exceptional heart care, we have created designated Provider Care Teams.  These Care Teams include your primary Cardiologist (physician) and Advanced Practice Providers (APPs -  Physician Assistants and Nurse Practitioners) who all work together to provide you with the care you need, when you need it.  We recommend signing up for the patient portal called "MyChart".  Sign up information is provided on this After Visit Summary.  MyChart is used to connect with patients for Virtual Visits (Telemedicine).  Patients are able to view lab/test results, encounter notes, upcoming appointments, etc.  Non-urgent messages can be sent to your provider as well.   To learn more about what you can do with MyChart, go to [email or web address].    Your next appointment:   12 month(s)  The format for your next appointment:   In Person  Provider:   Katarina Nelson, MD     

## 2019-06-12 NOTE — Progress Notes (Signed)
Cardiology Office Note:    Date:  06/12/2019   ID:  Kathleen Thomas, DOB [birth year], MRN [id number]  PCP:  Deland Pretty, MD  Cardiologist:  No primary care provider on file.  Electrophysiologist:  None   Referring MD: Deland Pretty, MD   Reason for visit: 1 year follow-up  History of Present Illness:    Kathleen Thomas is a 74 y.o. female with a hx of palpitations, hypertenion and history of a heart murmur. Echocardiogram in 2018 which again showed an ejection fraction of 55-65% with mild aortic insufficiency and mild mitral regurgitation.  Patient is coming after a year, she remains very active and can work up to 6 hours in her yard without any symptoms of chest pain or shortness of breath.  She just feels slightly more fatigued after physical activity.  She occasionally gets orthostatic hypotension however rarely and not associated with presyncope or syncope.  She has no lower extremity edema.  She continues to take red yeast rice and fish oil without any side effects.  She recently had her blood work done at her PCP office.  She occasionally feels palpitations at night not during the day.  Past Medical History:  Diagnosis Date  . Anemia   . Anxiety   . Aortic insufficiency    mild  . Cherry angioma   . Depression    recurrent  . Dermatitis   . Hallux valgus (acquired), left foot    with bunions  . Hammer toes of both feet   . Helicobacter pylori infection   . Hyperlipidemia   . Hypertension   . Leukopenia    mild  . Mitral regurgitation    mild  . Osteopenia   . Osteoporosis   . Pure hypercholesterolemia   . Scoliosis   . Seborrheic keratoses   . Vitamin D deficiency     Past Surgical History:  Procedure Laterality Date  . childbirth     x2  . COLONOSCOPY    . TONSILLECTOMY    . TUBAL LIGATION      Current Medications: Current Meds  Medication Sig  . irbesartan (AVAPRO) 150 MG tablet Take 75 mg by mouth daily.  . Omega-3 Fatty Acids (FISH OIL) 1200 MG  CAPS Take 1 capsule by mouth daily.  . Red Yeast Rice Extract 600 MG CAPS Take 1 capsule (600 mg total) by mouth daily.     Allergies:   Actonel [risedronate sodium] and Fosamax [alendronate sodium]   Social History   Socioeconomic History  . Marital status: Married    Spouse name: Not on file  . Number of children: 1  . Years of education: Not on file  . Highest education level: Not on file  Occupational History  . Not on file  Tobacco Use  . Smoking status: Never Smoker  . Smokeless tobacco: Never Used  Substance and Sexual Activity  . Alcohol use: Never  . Drug use: Never  . Sexual activity: Not on file  Other Topics Concern  . Not on file  Social History Narrative   The patient is married she does have 1 son who lives with her, he has a seizure disorder   Her husband lives in a group home due to bipolar disorder   Alcohol, tobacco or drug use all negative   Retired from [employer] Strain:   . Difficulty of Paying Living Expenses:   Food Insecurity:   . Worried  About Running Out of Food in the Last Year:   . [place] in the Last Year:   Transportation Needs:   . Lack of Transportation (Medical):   Marland Kitchen Lack of Transportation (Non-Medical):   Physical Activity:   . Days of Exercise per Week:   . Minutes of Exercise per Session:   Stress:   . Feeling of Stress :   Social Connections:   . Frequency of Communication with Friends and Family:   . Frequency of Social Gatherings with Friends and Family:   . Attends Religious Services:   . Active Member of Clubs or Organizations:   . Attends Archivist Meetings:   Marland Kitchen Marital Status:      Family History: The patient's family history includes Atrial fibrillation in her mother; Congestive Heart Failure in her father and mother; Epilepsy in her son.  ROS:   Please see the history of present illness.    All other systems reviewed and are  negative.  EKGs/Labs/Other Studies Reviewed:    The following studies were reviewed today:  EKG:  EKG is ordered today.  The ekg ordered today demonstrates sinus rhythm, normal EKG, unchanged from prior.  This was personally reviewed.  Recent Labs: No results found for requested labs within last 8760 hours.  Recent Lipid Panel No results found for: CHOL, TRIG, HDL, CHOLHDL, VLDL, LDLCALC, LDLDIRECT  Physical Exam:    VS:  BP 122/78   Pulse 61   Ht 5' 6.5" (1.689 m)   Wt 141 lb (64 kg)   SpO2 97%   BMI 22.42 kg/m     Wt Readings from Last 3 Encounters:  06/12/19 141 lb (64 kg)  04/11/19 141 lb (64 kg)  05/31/18 135 lb (61.2 kg)    GEN:  Well nourished, well developed in no acute distress HEENT: Normal NECK: No JVD; No carotid bruits LYMPHATICS: No lymphadenopathy CARDIAC: RRR, 2 out of 6 systolic and diastolic murmurs, rubs, gallops RESPIRATORY:  Clear to auscultation without rales, wheezing or rhonchi  ABDOMEN: Soft, non-tender, non-distended MUSCULOSKELETAL:  No edema; No deformity  SKIN: Warm and dry NEUROLOGIC:  Alert and oriented x 3 PSYCHIATRIC:  Normal affect   ASSESSMENT:    1. Aortic valve insufficiency, etiology of cardiac valve disease unspecified   2. Mitral valve insufficiency, unspecified etiology   3. Hyperlipidemia, unspecified hyperlipidemia type   4. PVCs (premature ventricular contractions)   5. Essential hypertension    PLAN:    In order of problems listed above:  1. Mild mitral regurgitation - stable on the echo in 2018, will repeat now 2. Mild aortic insufficiency - stable on the echo in 2018, will repeat now 3. Past history of PVCs, improved, only occasional palpitations at night, her baseline heart rate is low, no medication is necessary. 4.  Hyperlipidemia - on red yeast rice, no known CAD. We will obtain labs from her PCP.  Last year triglycerides 51, HDL 69, LDL 142, we will follow. 5. Hypertension - well controlled, continue  irbesartan.   Medication Adjustments/Labs and Tests Ordered: Current medicines are reviewed at length with the patient today.  Concerns regarding medicines are outlined above.  Orders Placed This Encounter  Procedures  . EKG 12-Lead  . ECHOCARDIOGRAM COMPLETE   No orders of the defined types were placed in this encounter.   Patient Instructions  Medication Instructions:   Your physician recommends that you continue on your current medications as directed. Please refer to the Current Medication list  given to you today.  *If you need a refill on your cardiac medications before your next appointment, please call your pharmacy*   Testing/Procedures:  Your physician has requested that you have an echocardiogram. Echocardiography is a painless test that uses sound waves to create images of your heart. It provides your doctor with information about the size and shape of your heart and how well your heart's chambers and valves are working. This procedure takes approximately one hour. There are no restrictions for this procedure.  Follow-Up: At [facility], you and your health needs are our priority.  As part of our continuing mission to provide you with exceptional heart care, we have created designated Provider Care Teams.  These Care Teams include your primary Cardiologist (physician) and Advanced Practice Providers (APPs -  Physician Assistants and Nurse Practitioners) who all work together to provide you with the care you need, when you need it.  We recommend signing up for the patient portal called "MyChart".  Sign up information is provided on this After Visit Summary.  MyChart is used to connect with patients for Virtual Visits (Telemedicine).  Patients are able to view lab/test results, encounter notes, upcoming appointments, etc.  Non-urgent messages can be sent to your provider as well.   To learn more about what you can do with MyChart, go to [email or web address].    Your next  appointment:   12 month(s)  The format for your next appointment:   In Person  Provider:   Ena Dawley, MD        Signed, Ena Dawley, MD  06/12/2019 8:41 AM    [place]

## 2019-06-26 ENCOUNTER — Other Ambulatory Visit: Payer: Self-pay | Admitting: Cardiology

## 2019-06-28 ENCOUNTER — Ambulatory Visit (HOSPITAL_COMMUNITY): Payer: No Typology Code available for payment source | Attending: Cardiology

## 2019-06-28 ENCOUNTER — Other Ambulatory Visit: Payer: Self-pay

## 2019-06-28 DIAGNOSIS — I351 Nonrheumatic aortic (valve) insufficiency: Secondary | ICD-10-CM | POA: Diagnosis present

## 2019-12-17 ENCOUNTER — Encounter: Payer: Self-pay | Admitting: Internal Medicine

## 2020-01-30 ENCOUNTER — Other Ambulatory Visit: Payer: Self-pay

## 2020-01-30 ENCOUNTER — Telehealth: Payer: Self-pay | Admitting: *Deleted

## 2020-01-30 ENCOUNTER — Ambulatory Visit (AMBULATORY_SURGERY_CENTER): Payer: Self-pay | Admitting: *Deleted

## 2020-01-30 VITALS — Ht 66.5 in | Wt 138.0 lb

## 2020-01-30 DIAGNOSIS — R1115 Cyclical vomiting syndrome unrelated to migraine: Secondary | ICD-10-CM

## 2020-01-30 DIAGNOSIS — R194 Change in bowel habit: Secondary | ICD-10-CM

## 2020-01-30 NOTE — Progress Notes (Signed)

## 2020-01-30 NOTE — Telephone Encounter (Signed)
Requesting records from Dr Loreta Ave to be sent for Dr Leone Payor .  Please be on lookout. Dr Leone Payor saw  patient last year and approved EGD/Colonoscopy, (patient cancelled , now rescheuled) Attempting to get records because patient unclear of her results with DR Loreta Ave.

## 2020-02-13 ENCOUNTER — Encounter: Payer: Self-pay | Admitting: Internal Medicine

## 2020-02-18 ENCOUNTER — Encounter: Payer: No Typology Code available for payment source | Admitting: Internal Medicine

## 2020-02-20 ENCOUNTER — Ambulatory Visit (AMBULATORY_SURGERY_CENTER): Payer: No Typology Code available for payment source | Admitting: Internal Medicine

## 2020-02-20 ENCOUNTER — Encounter: Payer: Self-pay | Admitting: Internal Medicine

## 2020-02-20 ENCOUNTER — Other Ambulatory Visit: Payer: Self-pay

## 2020-02-20 VITALS — BP 126/75 | HR 59 | Temp 97.5°F | Resp 12 | Ht 66.0 in | Wt 141.0 lb

## 2020-02-20 DIAGNOSIS — K449 Diaphragmatic hernia without obstruction or gangrene: Secondary | ICD-10-CM | POA: Diagnosis not present

## 2020-02-20 DIAGNOSIS — R194 Change in bowel habit: Secondary | ICD-10-CM

## 2020-02-20 DIAGNOSIS — K5730 Diverticulosis of large intestine without perforation or abscess without bleeding: Secondary | ICD-10-CM

## 2020-02-20 DIAGNOSIS — R1115 Cyclical vomiting syndrome unrelated to migraine: Secondary | ICD-10-CM | POA: Diagnosis not present

## 2020-02-20 DIAGNOSIS — K31819 Angiodysplasia of stomach and duodenum without bleeding: Secondary | ICD-10-CM

## 2020-02-20 HISTORY — DX: Angiodysplasia of stomach and duodenum without bleeding: K31.819

## 2020-02-20 MED ORDER — SODIUM CHLORIDE 0.9 % IV SOLN: 500.0000 mL | INTRAVENOUS | Status: DC

## 2020-02-20 NOTE — Patient Instructions (Signed)
Handouts given on diverticulosis and hiatal hernia.  YOU HAD AN ENDOSCOPIC PROCEDURE TODAY: Refer to the procedure report and other information in the discharge instructions given to you for any specific questions about what was found during the examination. If this information does not answer your questions, please call [place] office at [phone] to clarify.   YOU SHOULD EXPECT: Some feelings of bloating in the abdomen. Passage of more gas than usual. Walking can help get rid of the air that was put into your GI tract during the procedure and reduce the bloating. If you had a lower endoscopy (such as a colonoscopy or flexible sigmoidoscopy) you may notice spotting of blood in your stool or on the toilet paper. Some abdominal soreness may be present for a day or two, also.  DIET: Your first meal following the procedure should be a light meal and then it is ok to progress to your normal diet. A half-sandwich or bowl of soup is an example of a good first meal. Heavy or fried foods are harder to digest and may make you feel nauseous or bloated. Drink plenty of fluids but you should avoid alcoholic beverages for 24 hours. If you had a esophageal dilation, please see attached instructions for diet.    ACTIVITY: Your care partner should take you home directly after the procedure. You should plan to take it easy, moving slowly for the rest of the day. You can resume normal activity the day after the procedure however YOU SHOULD NOT DRIVE, use power tools, machinery or perform tasks that involve climbing or major physical exertion for 24 hours (because of the sedation medicines used during the test).   SYMPTOMS TO REPORT IMMEDIATELY: A gastroenterologist can be reached at any hour. Please call [phone]  for any of the following symptoms:  . Following lower endoscopy (colonoscopy, flexible sigmoidoscopy) Excessive amounts of blood in the stool  Significant tenderness, worsening of abdominal pains   Swelling of the abdomen that is new, acute  Fever of 100 or higher  . Following upper endoscopy (EGD, EUS, ERCP, esophageal dilation) Vomiting of blood or coffee ground material  New, significant abdominal pain  New, significant chest pain or pain under the shoulder blades  Painful or persistently difficult swallowing  New shortness of breath  Black, tarry-looking or red, bloody stools  FOLLOW UP:  If any biopsies were taken you will be contacted by phone or by letter within the next 1-3 weeks. Call [phone]  if you have not heard about the biopsies in 3 weeks.  Please also call with any specific questions about appointments or follow up tests.

## 2020-02-20 NOTE — Progress Notes (Signed)
A/ox3, pleased with MAC, report to RN 

## 2020-02-20 NOTE — Op Note (Signed)
[place] [facility] Patient Name: Kathleen Thomas Procedure Date: 02/20/2020 11:02 AM MRN: [id number] Endoscopist: Gatha Mayer , MD Age: 75 Referring MD:  Date of Birth: 06-25-1945 Gender: Female Account #: [id number] Procedure:                Upper GI endoscopy Indications:              Persistent vomiting of unknown cause Medicines:                Propofol per Anesthesia, Monitored Anesthesia Care Procedure:                After obtaining informed consent, the endoscope was                            passed under direct vision. Throughout the                            procedure, the patient's blood pressure, pulse, and                            oxygen saturations were monitored continuously. The                            Endoscope was introduced through the mouth, and                            advanced to the second part of duodenum. The upper                            GI endoscopy was accomplished without difficulty.                            The patient tolerated the procedure well. Scope In: Scope Out: Findings:                 The esophagus was normal.                           A small hiatal hernia was present.                           The exam of the stomach was otherwise normal.                           The examined duodenum was normal.                           The cardia and gastric fundus were normal on                            retroflexion.                           A single small angiodysplastic lesion with no                            bleeding was found  in the gastric fundus. Complications:            No immediate complications. Estimated Blood Loss:     Estimated blood loss: none. Impression:               - Normal esophagus.                           - Small hiatal hernia.                           - Normal examined duodenum.                           - A single non-bleeding angiodysplastic lesion in                            the stomach.                            - No specimens collected. Recommendation:           - Patient has a contact number available for                            emergencies. The signs and symptoms of potential                            delayed complications were discussed with the                            patient. Return to normal activities tomorrow.                            Written discharge instructions were provided to the                            patient.                           - See the other procedure note for documentation of                            additional recommendations. Gatha Mayer, MD 02/20/2020 11:42:40 AM This report has been signed electronically.

## 2020-02-20 NOTE — Op Note (Signed)
[facility] Patient Name: Kathleen Thomas Procedure Date: 02/20/2020 10:44 AM MRN: [id number] Endoscopist: Gatha Mayer , MD Age: 75 Referring MD:  Date of Birth: [birth year] Gender: Female Account #: [id number] Procedure:                Colonoscopy Indications:              Change in bowel habits Medicines:                Propofol per Anesthesia, Monitored Anesthesia Care Procedure:                After obtaining informed consent, the colonoscope                            was passed under direct vision. Throughout the                            procedure, the patient's blood pressure, pulse, and                            oxygen saturations were monitored continuously. The                            Olympus [id number]) Colonoscope was                            introduced through the anus and advanced to the the                            cecum, identified by appendiceal orifice and                            ileocecal valve. The colonoscopy was performed                            without difficulty. The patient tolerated the                            procedure well. The quality of the bowel                            preparation was good. The ileocecal valve,                            appendiceal orifice, and rectum were photographed. Scope In: 11:09:47 AM Scope Out: 11:25:45 AM Scope Withdrawal Time: 0 hours 10 minutes 53 seconds  Total Procedure Duration: 0 hours 15 minutes 58 seconds  Findings:                 The perianal examination was normal.                           The digital rectal exam findings include decreased                            sphincter tone.  Multiple diverticula were found in the sigmoid                            colon.                           The exam was otherwise without abnormality on                            direct and retroflexion views. Complications:            No immediate  complications. Estimated Blood Loss:     Estimated blood loss: none. Impression:               - Decreased sphincter tone found on digital rectal                            exam.                           - Diverticulosis in the sigmoid colon.                           - The examination was otherwise normal on direct                            and retroflexion views.                           - No specimens collected. Recommendation:           - Patient has a contact number available for                            emergencies. The signs and symptoms of potential                            delayed complications were discussed with the                            patient. Return to normal activities tomorrow.                            Written discharge instructions were provided to the                            patient.                           - Resume previous diet.                           - Continue present medications.                           - No repeat colonoscopy due to current age (106  years or older) and the absence of colonic polyps.                            stay off red yeast rice - caused loose stools Gatha Mayer, MD 02/20/2020 11:45:13 AM This report has been signed electronically.

## 2020-02-22 ENCOUNTER — Telehealth: Payer: Self-pay | Admitting: *Deleted

## 2020-02-22 NOTE — Telephone Encounter (Signed)
°  Follow up Call-  Call back number 02/20/2020  Post procedure Call Back phone  # [phone]  Permission to leave phone message Yes  Some recent data might be hidden     Patient questions:  Do you have a fever, pain , or abdominal swelling? No. Pain Score  0 *  Have you tolerated food without any problems? Yes.    Have you been able to return to your normal activities? Yes.    Do you have any questions about your discharge instructions: Diet   No. Medications  No. Follow up visit  No.  Do you have questions or concerns about your Care? No.  Actions: * If pain score is 4 or above: No action needed, pain <4.  1. Have you developed a fever since your procedure? no  2.   Have you had an respiratory symptoms (SOB or cough) since your procedure? no  3.   Have you tested positive for COVID 19 since your procedure no  4.   Have you had any family members/close contacts diagnosed with the COVID 19 since your procedure?  no   If yes to any of these questions please route to Joylene John, RN and Joella Prince, RN

## 2020-02-22 NOTE — Telephone Encounter (Signed)
  Follow up Call- ° °Call back number 02/20/2020  °Post procedure Call Back phone  # [phone]  °Permission to leave phone message Yes  °Some recent data might be hidden  °  ° °Patient questions: ° °Do you have a fever, pain , or abdominal swelling? No. °Pain Score  0 * ° °Have you tolerated food without any problems? Yes.   ° °Have you been able to return to your normal activities? Yes.   ° °Do you have any questions about your discharge instructions: °Diet   No. °Medications  No. °Follow up visit  No. ° °Do you have questions or concerns about your Care? No. ° °Actions: °* If pain score is 4 or above: °No action needed, pain <4. ° °1. Have you developed a fever since your procedure? no ° °2.   Have you had an respiratory symptoms (SOB or cough) since your procedure? no ° °3.   Have you tested positive for COVID 19 since your procedure no ° °4.   Have you had any family members/close contacts diagnosed with the COVID 19 since your procedure?  no ° ° °If yes to any of these questions please route to Tracy Walton, RN and Denise Buckner, RN ° °

## 2020-02-26 NOTE — Telephone Encounter (Signed)
I have called Dr Lorie Apley office and left a voice mail for Lattie Haw to check on the status of the records for this patient.

## 2020-02-27 ENCOUNTER — Encounter: Payer: Self-pay | Admitting: Family Medicine

## 2020-02-27 ENCOUNTER — Ambulatory Visit: Payer: No Typology Code available for payment source | Admitting: Family Medicine

## 2020-02-27 ENCOUNTER — Other Ambulatory Visit: Payer: Self-pay

## 2020-02-27 VITALS — BP 110/78 | HR 68 | Temp 97.7°F | Ht 65.1 in | Wt 135.5 lb

## 2020-02-27 DIAGNOSIS — E782 Mixed hyperlipidemia: Secondary | ICD-10-CM

## 2020-02-27 DIAGNOSIS — G4700 Insomnia, unspecified: Secondary | ICD-10-CM | POA: Diagnosis not present

## 2020-02-27 DIAGNOSIS — I340 Nonrheumatic mitral (valve) insufficiency: Secondary | ICD-10-CM

## 2020-02-27 DIAGNOSIS — M25561 Pain in right knee: Secondary | ICD-10-CM

## 2020-02-27 DIAGNOSIS — I10 Essential (primary) hypertension: Secondary | ICD-10-CM | POA: Insufficient documentation

## 2020-02-27 DIAGNOSIS — M25562 Pain in left knee: Secondary | ICD-10-CM

## 2020-02-27 DIAGNOSIS — Z860100 Personal history of colon polyps, unspecified: Secondary | ICD-10-CM | POA: Insufficient documentation

## 2020-02-27 DIAGNOSIS — Z6379 Other stressful life events affecting family and household: Secondary | ICD-10-CM

## 2020-02-27 DIAGNOSIS — E785 Hyperlipidemia, unspecified: Secondary | ICD-10-CM | POA: Insufficient documentation

## 2020-02-27 DIAGNOSIS — G8929 Other chronic pain: Secondary | ICD-10-CM

## 2020-02-27 NOTE — Assessment & Plan Note (Signed)
Well controlled. Cont irbesartan

## 2020-02-27 NOTE — Progress Notes (Signed)
Subjective:     Kathleen Thomas is a 75 y.o. female presenting for Establish Care, Knee Pain ("pain in joints"), Insomnia, Anxiety ("adult son lives with her and has frequent seizures/alcohol abuse"), and Toe Pain (Right big toe nail pain )     HPI  #insomnia - feels the anxiety would be improved if she could sleep better - the bottom line of the trouble is having a son who is abusing alcohol and having seizures and is not willing to get medical care or treatment - difficulty staying asleep - is typically able to fall asleep and sleeps for 3 hours - will wake up and stay in bed or sit up and read - waking up to use the bathroom typically - will fall back asleep 30 minutes to 2 hours - no daytime - is still sleeping 6-7 hours overnight - does not leave her bedroom if she reading - last drink before bed - 6 pm will drink with dinner - bedtime 10 pm - last drink is around 8 pm - uses the bathroom before bed - denies significant nighttime anxiety  Anxiety is triggered by her son's health -- falls, seizure, etc -- he has something happen about twice a week -- but will be bursts 3 days in a row and then a break for a week  Her son will try to limit alcohol - but per report this is often what seems to trigger seizures but he has not gone to the doctor   #knee pain - cortisone in both knees with [facility] - pt w/ improvement - not as severe as it was  Review of Systems   Social History   Tobacco Use  Smoking Status Never Smoker  Smokeless Tobacco Never Used        Objective:    BP Readings from Last 3 Encounters:  02/27/20 110/78  02/20/20 126/75  06/12/19 122/78   Wt Readings from Last 3 Encounters:  02/27/20 135 lb 8 oz (61.5 kg)  02/20/20 141 lb (64 kg)  01/30/20 138 lb (62.6 kg)    BP 110/78   Pulse 68   Temp 97.7 F (36.5 C) (Temporal)   Ht 5' 5.1" (1.654 m)   Wt 135 lb 8 oz (61.5 kg)   SpO2 98%   BMI 22.48 kg/m    Physical  Exam Constitutional:      General: She is not in acute distress.    Appearance: She is well-developed. She is not diaphoretic.  HENT:     Right Ear: External ear normal.     Left Ear: External ear normal.  Eyes:     Conjunctiva/sclera: Conjunctivae normal.  Cardiovascular:     Rate and Rhythm: Normal rate and regular rhythm.     Heart sounds: Murmur heard.    Pulmonary:     Effort: Pulmonary effort is normal. No respiratory distress.     Breath sounds: Normal breath sounds. No wheezing.  Musculoskeletal:     Cervical back: Neck supple.  Skin:    General: Skin is warm and dry.     Capillary Refill: Capillary refill takes less than 2 seconds.  Neurological:     Mental Status: She is alert. Mental status is at baseline.  Psychiatric:        Mood and Affect: Mood normal.        Behavior: Behavior normal.      GAD 7 : Generalized Anxiety Score 02/27/2020  Nervous, Anxious, on Edge 1  Worry  too much - different things 0  Trouble relaxing 1  Restless 1  Easily annoyed or irritable 0  Afraid - awful might happen 1  Anxiety Difficulty Somewhat difficult         Assessment & Plan:   Problem List Items Addressed This Visit      Cardiovascular and Mediastinum   Mitral regurgitation - Primary   Relevant Orders   CT CARDIAC SCORING   Hypertension    Well controlled. Cont irbesartan      Relevant Orders   CT CARDIAC SCORING     Other   Bilateral chronic knee pain    Encouraged f/u with me or Dr. Lorelei Pont if she feels she needs another injection - had benefit in the past.       Hyperlipidemia    Declined statin. Not on red yeast rice as this was causing digestive issues. Discussed Coronary Ca score to help guide statin prescribing and she would like to do this.       Relevant Orders   CT CARDIAC SCORING   Insomnia    Suspect multifactorial - nocturia + increased stress. Trial of decreasing evening hydration and melatonin to see if that improves symptoms.        Stress due to illness of family member    Encouraged considering therapy. At this point do not think she would require medication. Son with alcohol abuse and seizure disorder (vs withdrawal seizures).           Return in about 1 year (around 02/26/2021), or if symptoms worsen or fail to improve.  Lesleigh Noe, MD  This visit occurred during the SARS-CoV-2 public health emergency.  Safety protocols were in place, including screening questions prior to the visit, additional usage of staff PPE, and extensive cleaning of exam room while observing appropriate contact time as indicated for disinfecting solutions.

## 2020-02-27 NOTE — Assessment & Plan Note (Signed)
Encouraged considering therapy. At this point do not think she would require medication. Son with alcohol abuse and seizure disorder (vs withdrawal seizures).

## 2020-02-27 NOTE — Assessment & Plan Note (Signed)
Encouraged f/u with me or Dr. Lorelei Pont if she feels she needs another injection - had benefit in the past.

## 2020-02-27 NOTE — Assessment & Plan Note (Signed)
Suspect multifactorial - nocturia + increased stress. Trial of decreasing evening hydration and melatonin to see if that improves symptoms.

## 2020-02-27 NOTE — Assessment & Plan Note (Addendum)
Declined statin. Not on red yeast rice as this was causing digestive issues. Discussed Coronary Ca score to help guide statin prescribing and she would like to do this.

## 2020-02-27 NOTE — Patient Instructions (Addendum)
#  Insomnia - do not drink after 7 pm  - if you lay in bed for more than 15 minutes -- getting out bed and reading there - and then go back to bed when tired  Over the counter sleep aids - melatonin 3-5 mg, take 1 hour bed And try - Unisom - take 30-60 minutes  Consider contacting a professional therapist  -- [facility] is one option. Call [phone] -- Or you can check out www.psychologytoday.com -- you can read bios of therapists and see if they accept insurance -- Check with your insurance to see if you have coverage and who may take your insurance  Dr. Lorelei Pont

## 2020-02-29 NOTE — Telephone Encounter (Signed)
Records came and have been placed in Dr Gessner's office for review. 

## 2020-03-27 ENCOUNTER — Other Ambulatory Visit: Payer: Self-pay

## 2020-03-27 ENCOUNTER — Ambulatory Visit (INDEPENDENT_AMBULATORY_CARE_PROVIDER_SITE_OTHER)
Admission: RE | Admit: 2020-03-27 | Discharge: 2020-03-27 | Disposition: A | Discharge status: Home / Self Care | Payer: Self-pay | Source: Ambulatory Visit | Attending: Family Medicine | Admitting: Family Medicine

## 2020-03-27 DIAGNOSIS — E782 Mixed hyperlipidemia: Secondary | ICD-10-CM

## 2020-03-27 DIAGNOSIS — I340 Nonrheumatic mitral (valve) insufficiency: Secondary | ICD-10-CM

## 2020-03-27 DIAGNOSIS — I10 Essential (primary) hypertension: Secondary | ICD-10-CM
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
## 2020-06-27 ENCOUNTER — Other Ambulatory Visit: Payer: Self-pay

## 2020-06-27 ENCOUNTER — Ambulatory Visit: Payer: No Typology Code available for payment source | Admitting: Cardiology

## 2020-06-27 ENCOUNTER — Encounter: Payer: Self-pay | Admitting: Cardiology

## 2020-06-27 VITALS — BP 140/80 | HR 59 | Ht 65.1 in | Wt 134.4 lb

## 2020-06-27 DIAGNOSIS — I351 Nonrheumatic aortic (valve) insufficiency: Secondary | ICD-10-CM | POA: Diagnosis not present

## 2020-06-27 DIAGNOSIS — M1990 Unspecified osteoarthritis, unspecified site: Secondary | ICD-10-CM

## 2020-06-27 DIAGNOSIS — I10 Essential (primary) hypertension: Secondary | ICD-10-CM

## 2020-06-27 DIAGNOSIS — E785 Hyperlipidemia, unspecified: Secondary | ICD-10-CM

## 2020-06-27 DIAGNOSIS — I340 Nonrheumatic mitral (valve) insufficiency: Secondary | ICD-10-CM | POA: Diagnosis not present

## 2020-06-27 DIAGNOSIS — I493 Ventricular premature depolarization: Secondary | ICD-10-CM

## 2020-06-27 MED ORDER — EZETIMIBE 10 MG PO TABS: 10.0000 mg | ORAL_TABLET | Freq: Every day | ORAL | 3 refills | Status: DC

## 2020-06-27 NOTE — Patient Instructions (Signed)
Medication Instructions:  START Zetia 10 mg tab daily *If you need a refill on your cardiac medications before your next appointment, please call your pharmacy*   Lab Work: Lipid Panel in 6 weeks If you have labs (blood work) drawn today and your tests are completely normal, you will receive your results only by: Marland Kitchen MyChart Message (if you have MyChart) OR . A paper copy in the mail If you have any lab test that is abnormal or we need to change your treatment, we will call you to review the results.   Testing/Procedures: None   Follow-Up: At [facility], you and your health needs are our priority.  As part of our continuing mission to provide you with exceptional heart care, we have created designated Provider Care Teams.  These Care Teams include your primary Cardiologist (physician) and Advanced Practice Providers (APPs -  Physician Assistants and Nurse Practitioners) who all work together to provide you with the care you need, when you need it.  We recommend signing up for the patient portal called "MyChart".  Sign up information is provided on this After Visit Summary.  MyChart is used to connect with patients for Virtual Visits (Telemedicine).  Patients are able to view lab/test results, encounter notes, upcoming appointments, etc.  Non-urgent messages can be sent to your provider as well.   To learn more about what you can do with MyChart, go to [email or web address].    Your next appointment:   1 year(s)  The format for your next appointment:   In Person  Provider:   You may see Dr. Johney Frame or one of the following Advanced Practice Providers on your designated Care Team:    Richardson Dopp, PA-C  Robbie Lis, [place]    Other Instructions

## 2020-08-11 ENCOUNTER — Other Ambulatory Visit: Payer: No Typology Code available for payment source

## 2020-08-14 ENCOUNTER — Other Ambulatory Visit: Payer: Self-pay

## 2020-08-14 MED ORDER — IRBESARTAN 150 MG PO TABS
150.0000 mg | ORAL_TABLET | Freq: Every day | ORAL | 3 refills | Status: DC
Start: 2020-08-14 — End: 2021-02-25

## 2020-08-27 ENCOUNTER — Ambulatory Visit: Payer: No Typology Code available for payment source | Admitting: Psychiatry

## 2020-08-27 ENCOUNTER — Other Ambulatory Visit: Payer: Self-pay

## 2020-08-27 DIAGNOSIS — F411 Generalized anxiety disorder: Secondary | ICD-10-CM

## 2020-08-27 NOTE — Progress Notes (Signed)
PROBLEM-FOCUSED INITIAL PSYCHOTHERAPY EVALUATION Luan Moore, PhD LP [facility]  Name: DANNY PONCEDELEON Date: 08/27/2020 Time spent: 50 min MRN: [id number] DOB: 11/19/1945 Guardian/Payee: self  PCP: Deland Pretty, MD Documentation requested on this visit: No  PROBLEM HISTORY Reason for Visit /Presenting Problem:  Chief Complaint  Patient presents with   Establish Care   Stress    Narrative/History of Present Illness Referred by family friends.  First time therapy.  Not interested in medication, needs to talk.  Dealing with some severe pain, sees chiropractor/wellness Dr. Micheline Chapman.  Supplements and work are helping.  Noticed that situations trigger pain -- 73yo son [name] living with her since Sept 2018, following seizures.  He abuses alcohol, is on disability, ostensibly for seizures.  Drinking, lazy behavior, picking arguments bother her.  [place].  Arguing got better when she told him she thought they needed to part ways.  Lived by herself 15 years before this, with her husband in a memory care facility.  Nervous breakdown 1988.  Knows it was hard for her kids, [name] and Danae Chen, and [name] was worried his father would harm Chai.  For a while [name] was laughing at her, tolerated it 2 years before conflict.    [name] has what sound like absence seizures, generally a few minutes at best, hours long if he is on alcohol.  3-4 wks ago had one that lasted several hours an put him in ED.  Now that he's on a medication (anticonvulsant?) drinking less.  No history of disagreements, but hx of seeing his father sleeping or angry from the time he was 9 did affect him.    Herself, having [name] living with her has put a damper on her social life, can't have people over the way she used to.  Dilemma about leaving him to go on vacation with Danae Chen and her family and whether it would be tragic if she did.  [name] may say its OK then guilt trip her.  He used to see Comer Locket of this practice but quit psychiatry altogether.     Prior Psychiatric Assessment/Treatment:   Outpatient treatment: none Psychiatric hospitalization: no Psychological assessment/testing: no   Abuse/neglect screening: Victim of abuse: Not assessed at this time / none suspected.   Victim of neglect: Not assessed at this time / none suspected.   Perpetrator of abuse/neglect: Not assessed at this time / none suspected.   Witness / Exposure to Domestic Violence: Not assessed at this time / none suspected.   Witness to Community Violence:  Not assessed at this time / none suspected.   Protective Services Involvement: No.   Report needed: No.    Substance abuse screening: Current substance abuse: No.   History of impactful substance use/abuse: Not assessed at this time / none suspected.     FAMILY/SOCIAL HISTORY Family of origin -- deferred Family of intention/current living situation -- lives with Jake Shark, gets tense Education -- deferred [employer], retired Publishing rights manager -- stable [place] activities -- deferred Other situational factors affecting treatment and prognosis: Stressors from the following areas: Marital or family conflict Barriers to service: none stated  Notable cultural sensitivities: not applicable  Strengths: assertive communication   MED/SURG HISTORY Med/surg history was not reviewed with PT at this time.  Of note for psychotherapy at this time are age-related diseases that could mean substantial disability or death. 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   EGD - not ablated, not bleeing was incidental   Cherry angioma    Depression    recurrent   Dermatitis    Hallux valgus (acquired), left foot    with bunions   Hammer toes of both feet    Helicobacter pylori infection    Hyperlipidemia     Hypertension    Leukopenia    mild   Mitral regurgitation    mild   Osteopenia    Osteoporosis    Pure hypercholesterolemia    Scoliosis    Seborrheic keratoses    Vitamin D deficiency      Past Surgical History:  Procedure Laterality Date   BUNIONECTOMY Left    childbirth     x2   COLONOSCOPY     TONSILLECTOMY     TUBAL LIGATION      Allergies  Allergen Reactions   Actonel [Risedronate Sodium] Other (See Comments)    Flu like symptoms   Fosamax [Alendronate Sodium] Other (See Comments)    Abdominal pain    Medications (as listed in Epic): Current Outpatient Medications  Medication Sig Dispense Refill   Calcium Carbonate+Vitamin D 600-200 MG-UNIT TABS Take by mouth 2 (two) times daily.     Cholecalciferol (VITAMIN D3) 25 MCG (1000 UT) CAPS Take by mouth daily.     ezetimibe (ZETIA) 10 MG tablet Take 1 tablet (10 mg total) by mouth daily. 90 tablet 3   irbesartan (AVAPRO) 150 MG tablet Take 1 tablet (150 mg total) by mouth daily. 90 tablet 3   Magnesium Ascorbate POWD      Multiple Vitamins-Minerals (OSTEO COMPLEX PO) Take 2 tablets by mouth daily. Boswellia, ginger, hyaluronic acid supplement     No current facility-administered medications for this visit.    MENTAL STATUS AND OBSERVATIONS Appearance:   Neat     Behavior:  Appropriate  Motor:  Normal  Speech/Language:   Clear and Coherent  Affect:  Constricted/reserved  Mood:  anxious  Thought process:  normal  Thought content:    worry  Sensory/Perceptual disturbances:    WNL  Orientation:  Fully oriented  Attention:  Good  Concentration:  Good  Memory:  WNL  Fund of knowledge:   Good  Insight:    Good  Judgment:   Good  Impulse Control:  Good   Initial Risk Assessment: Danger to self: No Self-injurious behavior: No Danger to others: No Physical aggression / violence: No Duty to warn: No Access to firearms a concern: No Gang involvement: No Patient / guardian was educated about steps to take if  suicide or homicide risk level increases between visits: yes While future psychiatric events cannot be accurately predicted, the patient does not currently require acute inpatient psychiatric care and does not currently meet [facility] involuntary commitment criteria.   DIAGNOSIS:    ICD-10-CM   1. Generalized anxiety disorder  F41.1       INITIAL TREATMENT: Support/validation provided for distressing symptoms and confirmed rapport Ethical orientation and informed consent confirmed re: privacy rights -- including but not limited to HIPAA, EMR and use of e-PHI patient responsibilities -- scheduling, fair notice of changes, in-person vs. telehealth and regulatory and financial conditions affecting choice expectations for working relationship in psychotherapy needs and consents for working partnerships and exchange of information with other health care providers, especially any medication and other behavioral health providers Initial orientation  to cognitive-behavioral and solution-focused therapy approach Psychoeducation and initial recommendations: Framed [name] problems as multilayered with main need to get him involved in deciding help Outlook for therapy -- scheduling constraints, availability of crisis service, inclusion of family member(s) as appropriate  Plan: OK to go on this trip, provided health supervision is covered adequately For stress with caregiving and vacation, recommend having frank talk with [name] about whether he needs her or someone else to stand by for safety If encounters guilt tripping, OK to confront Maintain medication as prescribed and work faithfully with relevant prescriber(s) if any changes are desired or seem indicated Call the clinic on-call service, present to ER, or call 911 if any life-threatening psychiatric crisis Return in about 1 month (around 09/27/2020).  [name], PhD  Luan Moore, PhD LP Clinical Psychologist, [facility]  Group [facility] [address] [place], [place] [address] [phone]

## 2020-09-30 ENCOUNTER — Other Ambulatory Visit: Payer: Self-pay

## 2020-09-30 ENCOUNTER — Ambulatory Visit (INDEPENDENT_AMBULATORY_CARE_PROVIDER_SITE_OTHER): Payer: No Typology Code available for payment source | Admitting: Psychiatry

## 2020-09-30 DIAGNOSIS — F4323 Adjustment disorder with mixed anxiety and depressed mood: Secondary | ICD-10-CM | POA: Diagnosis not present

## 2020-09-30 DIAGNOSIS — Z636 Dependent relative needing care at home: Secondary | ICD-10-CM
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
## 2020-10-28 ENCOUNTER — Other Ambulatory Visit: Payer: Self-pay

## 2020-10-28 ENCOUNTER — Ambulatory Visit (INDEPENDENT_AMBULATORY_CARE_PROVIDER_SITE_OTHER): Payer: No Typology Code available for payment source | Admitting: Psychiatry

## 2020-10-28 DIAGNOSIS — F411 Generalized anxiety disorder: Secondary | ICD-10-CM | POA: Diagnosis not present

## 2020-10-28 DIAGNOSIS — F4323 Adjustment disorder with mixed anxiety and depressed mood: Secondary | ICD-10-CM

## 2020-10-28 DIAGNOSIS — Z636 Dependent relative needing care at home: Secondary | ICD-10-CM
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
## 2020-12-01 ENCOUNTER — Ambulatory Visit: Payer: No Typology Code available for payment source | Admitting: Psychiatry

## 2020-12-11 ENCOUNTER — Other Ambulatory Visit: Payer: Self-pay

## 2020-12-11 ENCOUNTER — Ambulatory Visit (INDEPENDENT_AMBULATORY_CARE_PROVIDER_SITE_OTHER): Payer: No Typology Code available for payment source | Admitting: Sports Medicine

## 2020-12-11 DIAGNOSIS — B351 Tinea unguium: Secondary | ICD-10-CM

## 2020-12-11 DIAGNOSIS — L603 Nail dystrophy: Secondary | ICD-10-CM | POA: Diagnosis not present

## 2020-12-11 DIAGNOSIS — M79674 Pain in right toe(s): Secondary | ICD-10-CM | POA: Diagnosis not present

## 2020-12-11 DIAGNOSIS — M21619 Bunion of unspecified foot: Secondary | ICD-10-CM

## 2020-12-11 DIAGNOSIS — M2040 Other hammer toe(s) (acquired), unspecified foot: Secondary | ICD-10-CM

## 2020-12-11 NOTE — Progress Notes (Signed)
Subjective: Kathleen Thomas is a 75 y.o. female patient seen today in office with complaint of mildly painful thickened and discolored right great toenail.  Patient reports that she was seen by her dermatologist who said that likely the nail was traumatized and referred her here.  Patient reports that she has not tried any treatment but does not like how being of the nail looks does not recall bumping the toe or dropping anything on it but states that that could still be a possibility that she could have bumped it and it started these changes however patient reports that the nail is growing so long and ugly that it is digging into the skin of the second toe.  Patient denies any other pedal complaints at this time.  Patient Active Problem List   Diagnosis Date Noted   Personal history of colonic polyps 02/27/2020   Hyperlipidemia 02/27/2020   Hypertension 02/27/2020   Insomnia 02/27/2020   Stress due to illness of family member 02/27/2020   Bilateral chronic knee pain 05/25/2019   Conductive hearing loss 04/08/2017   PVCs (premature ventricular contractions) 01/22/2011   Weight loss, unintentional 01/22/2011   Mitral regurgitation    Aortic insufficiency     Current Outpatient Medications on File Prior to Visit  Medication Sig Dispense Refill   Calcium Carbonate+Vitamin D 600-200 MG-UNIT TABS Take by mouth 2 (two) times daily.     Cholecalciferol (VITAMIN D3) 25 MCG (1000 UT) CAPS Take by mouth daily.     ezetimibe (ZETIA) 10 MG tablet Take 1 tablet (10 mg total) by mouth daily. 90 tablet 3   irbesartan (AVAPRO) 150 MG tablet Take 1 tablet (150 mg total) by mouth daily. 90 tablet 3   Magnesium Ascorbate POWD      Multiple Vitamins-Minerals (OSTEO COMPLEX PO) Take 2 tablets by mouth daily. Boswellia, ginger, hyaluronic acid supplement     No current facility-administered medications on file prior to visit.    Allergies  Allergen Reactions   Actonel [Risedronate Sodium] Other (See  Comments)    Flu like symptoms   Fosamax [Alendronate Sodium] Other (See Comments)    Abdominal pain    Objective: Physical Exam  General: Well developed, nourished, no acute distress, awake, alert and oriented x 3  Vascular: Dorsalis pedis artery 2/4 bilateral, Posterior tibial artery 1/4 bilateral, skin temperature warm to warm proximal to distal bilateral lower extremities, no varicosities, pedal hair present bilateral.  Neurological: Gross sensation present via light touch bilateral.   Dermatological: Skin is warm, dry, and supple bilateral, right hallux nail severely thickened with rams horn appearance with multiple trauma lines and significant subungual debris, no webspace macerations present bilateral, no open lesions present bilateral, there is a small kissing corn noted to the right medial second toe likely from the first toe rubbing it, no signs of infection bilateral.  Musculoskeletal: End-stage bunion and hammertoe deformities that are rigid.  No pain to palpation bilateral feet minimal tenderness to right great toe.  Assessment and Plan:  Problem List Items Addressed This Visit   None Visit Diagnoses     Nail fungus    -  Primary   Relevant Orders   Fungus culture w smear   Onychodystrophy       Toe pain, right       Bunion       Hammer toe, unspecified laterality           -Examined patient -Discussed treatment options for painful dystrophic right great toenail -Fungal  culture was obtained by removing a portion of the hard nail itself from each of the involved toenails using a sterile nail nipper and sent to [facility] lab. Patient tolerated the biopsy procedure well without discomfort or need for anesthesia.  -Toe Provided to prevent rubbing at the right second toe -Patient to return in 4 weeks for follow up evaluation and discussion of fungal culture results or sooner if symptoms worsen.  Landis Martins, DPM

## 2021-01-05 ENCOUNTER — Ambulatory Visit (INDEPENDENT_AMBULATORY_CARE_PROVIDER_SITE_OTHER): Payer: No Typology Code available for payment source | Admitting: Psychiatry

## 2021-01-05 ENCOUNTER — Other Ambulatory Visit: Payer: Self-pay

## 2021-01-05 DIAGNOSIS — F411 Generalized anxiety disorder: Secondary | ICD-10-CM

## 2021-01-05 DIAGNOSIS — Z636 Dependent relative needing care at home: Secondary | ICD-10-CM
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
## 2021-01-08 ENCOUNTER — Other Ambulatory Visit: Payer: Self-pay

## 2021-01-08 ENCOUNTER — Ambulatory Visit (INDEPENDENT_AMBULATORY_CARE_PROVIDER_SITE_OTHER): Payer: No Typology Code available for payment source | Admitting: Sports Medicine

## 2021-01-08 ENCOUNTER — Encounter: Payer: Self-pay | Admitting: Sports Medicine

## 2021-01-08 DIAGNOSIS — L603 Nail dystrophy: Secondary | ICD-10-CM

## 2021-01-08 DIAGNOSIS — M21619 Bunion of unspecified foot: Secondary | ICD-10-CM | POA: Diagnosis not present

## 2021-01-08 DIAGNOSIS — L84 Corns and callosities: Secondary | ICD-10-CM | POA: Diagnosis not present

## 2021-01-08 DIAGNOSIS — B351 Tinea unguium: Secondary | ICD-10-CM

## 2021-01-08 DIAGNOSIS — M2040 Other hammer toe(s) (acquired), unspecified foot: Secondary | ICD-10-CM

## 2021-01-08 NOTE — Progress Notes (Signed)
Subjective: Kathleen Thomas is a 75 y.o. female patient seen today in office for fungal culture results. Reports that she wants to see if its ok to use a use a corn pad on the toe. Patient has no other pedal complaints at this time.   Patient Active Problem List   Diagnosis Date Noted   Personal history of colonic polyps 02/27/2020   Hyperlipidemia 02/27/2020   Hypertension 02/27/2020   Insomnia 02/27/2020   Stress due to illness of family member 02/27/2020   Bilateral chronic knee pain 05/25/2019   Conductive hearing loss 04/08/2017   PVCs (premature ventricular contractions) 01/22/2011   Weight loss, unintentional 01/22/2011   Mitral regurgitation    Aortic insufficiency     Current Outpatient Medications on File Prior to Visit  Medication Sig Dispense Refill   Calcium Carbonate+Vitamin D 600-200 MG-UNIT TABS Take by mouth 2 (two) times daily.     Cholecalciferol (VITAMIN D3) 25 MCG (1000 UT) CAPS Take by mouth daily.     ezetimibe (ZETIA) 10 MG tablet Take 1 tablet (10 mg total) by mouth daily. 90 tablet 3   irbesartan (AVAPRO) 150 MG tablet Take 1 tablet (150 mg total) by mouth daily. 90 tablet 3   Magnesium Ascorbate POWD      Multiple Vitamins-Minerals (OSTEO COMPLEX PO) Take 2 tablets by mouth daily. Boswellia, ginger, hyaluronic acid supplement     No current facility-administered medications on file prior to visit.    Allergies  Allergen Reactions   Actonel [Risedronate Sodium] Other (See Comments)    Flu like symptoms   Fosamax [Alendronate Sodium] Other (See Comments)    Abdominal pain    Objective: Physical Exam  General: Well developed, nourished, no acute distress, awake, alert and oriented x 3  Vascular: Dorsalis pedis artery 2/4 bilateral, Posterior tibial artery 1/4 bilateral, skin temperature warm to warm proximal to distal bilateral lower extremities, no varicosities, pedal hair present bilateral.   Neurological: Gross sensation present via light touch  bilateral.    Dermatological: Skin is warm, dry, and supple bilateral, right hallux nail thicken with mild subungual debris, no webspace macerations present bilateral, no open lesions present bilateral, there is a small kissing corn noted to the right medial second toe likely from the first toe rubbing it like before, no signs of infection bilateral.   Musculoskeletal: End-stage bunion and hammertoe deformities that are rigid.  No pain to palpation bilateral feet minimal tenderness to right great toe.  Fungal culture + Fungus (T Rubrum and microtrauma)   Assessment and Plan:  Problem List Items Addressed This Visit   None Visit Diagnoses     Nail fungus    -  Primary   Onychodystrophy       Corn of toe       Bunion       Hammer toe, unspecified laterality           -Examined patient -Discussed treatment options for painful mycotic nails -Patient wants to think about the options of lamisil; laser;or topical solution for her nails -Advised patient to continue with good supportive shoes that do not rub toes -Advised patient to avoid using corn pads OTC with medication -Continue with toe caps -Patient to return in 12 weeks for follow up discussion about her nail fungus and corn trim or sooner if symptoms worsen.  Landis Martins, DPM

## 2021-02-11 ENCOUNTER — Ambulatory Visit: Payer: No Typology Code available for payment source | Admitting: Psychiatry

## 2021-02-12 ENCOUNTER — Telehealth: Payer: Self-pay | Admitting: Cardiology

## 2021-02-12 NOTE — Telephone Encounter (Signed)
Left the pt a message to call the office back. 

## 2021-02-12 NOTE — Telephone Encounter (Signed)
Patient states she has been having tingling in her left hand and numbness. She states if she is gripping something or in the mornings. She says it comes and goes and has no other symptoms.

## 2021-02-13 NOTE — Telephone Encounter (Signed)
Pt was calling into the office to make an appt with Dr. Johney Frame or an APP, as her Wellness MD advised her to for pain in left hand/wrist area with tingling.  Pt states she has tingling and pricking sensation to her left wrist and hand.  She states it sometimes goes numb. She reports her hands are always cold and white in color. She states the issue is aggravated by gripping something tightly.  Pt states she went and saw her Wellness Doctor the other day and he advised her to reach out to Cards to make an appt, for possible vascular work-up.  Pt has no cardiac complaints at all.  She has no neurological complaints.  Scheduled the pt to come into the office to see Richardson Dopp PA-C on 02/25/21 at 2:20 pm.  She is aware to arrive 15 mins prior to that appt.  Advised her to refrain from any heavy lifting or gripping.  Advised her to ice the area as needed for 20 mins.  Advised her she could lightly compress with ace wrap.  Advised to elevate at rest.  Advised her to call us back or report to her PCP if symptoms worsen. Informed the pt that I will route this message to Dr. Johney Frame to make her aware of this plan. Pt verbalized understanding and agrees with this plan.

## 2021-02-20 LAB — COLOGUARD: COLOGUARD: NEGATIVE

## 2021-02-25 ENCOUNTER — Ambulatory Visit: Payer: No Typology Code available for payment source | Admitting: Nurse Practitioner

## 2021-02-25 ENCOUNTER — Other Ambulatory Visit: Payer: Self-pay

## 2021-02-25 ENCOUNTER — Encounter: Payer: Self-pay | Admitting: Nurse Practitioner

## 2021-02-25 VITALS — BP 120/64 | HR 78 | Ht 66.0 in | Wt 134.8 lb

## 2021-02-25 DIAGNOSIS — I7121 Aneurysm of the ascending aorta, without rupture: Secondary | ICD-10-CM | POA: Diagnosis not present

## 2021-02-25 DIAGNOSIS — I351 Nonrheumatic aortic (valve) insufficiency: Secondary | ICD-10-CM

## 2021-02-25 DIAGNOSIS — R202 Paresthesia of skin: Secondary | ICD-10-CM

## 2021-02-25 DIAGNOSIS — E785 Hyperlipidemia, unspecified: Secondary | ICD-10-CM | POA: Diagnosis not present

## 2021-02-25 DIAGNOSIS — I779 Disorder of arteries and arterioles, unspecified: Secondary | ICD-10-CM

## 2021-02-25 DIAGNOSIS — I10 Essential (primary) hypertension: Secondary | ICD-10-CM

## 2021-02-25 DIAGNOSIS — I340 Nonrheumatic mitral (valve) insufficiency: Secondary | ICD-10-CM

## 2021-02-25 DIAGNOSIS — R200 Anesthesia of skin: Secondary | ICD-10-CM
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
## 2021-02-25 NOTE — Patient Instructions (Addendum)
Medication Instructions:   Your physician recommends that you continue on your current medications as directed. Please refer to the Current Medication list given to you today.  *If you need a refill on your cardiac medications before your next appointment, please call your pharmacy*   Lab Work:  TODAY !!!! BMET  If you have labs (blood work) drawn today and your tests are completely normal, you will receive your results only by: [place] (if you have MyChart) OR A paper copy in the mail If you have any lab test that is abnormal or we need to change your treatment, we will call you to review the results.   Testing/Procedures:  Your physician has requested that you have a carotid duplex. This test is an ultrasound of the carotid arteries in your neck. It looks at blood flow through these arteries that supply the brain with blood. Allow one hour for this exam. There are no restrictions or special instructions.  Non-Cardiac CT Angiography (CTA), is a special type of CT scan that uses a computer to produce multi-dimensional views of major blood vessels throughout the body. In CT angiography, a contrast material is injected through an IV to help visualize the blood vessels    Follow-Up: At [facility], you and your health needs are our priority.  As part of our continuing mission to provide you with exceptional heart care, we have created designated Provider Care Teams.  These Care Teams include your primary Cardiologist (physician) and Advanced Practice Providers (APPs -  Physician Assistants and Nurse Practitioners) who all work together to provide you with the care you need, when you need it.  We recommend signing up for the patient portal called "MyChart".  Sign up information is provided on this After Visit Summary.  MyChart is used to connect with patients for Virtual Visits (Telemedicine).  Patients are able to view lab/test results, encounter notes, upcoming appointments, etc.   Non-urgent messages can be sent to your provider as well.   To learn more about what you can do with MyChart, go to [email or web address].    Your next appointment:   5 month(s)  The format for your next appointment:   In Person  Provider:    Dr. Johney Frame.  If MD is not listed, click here to update    :  Blood Blood Pressure Record Sheet To take your blood pressure, you will need a blood pressure machine. You can buy a blood pressure machine (blood pressure monitor) at your clinic, drug store, or online. When choosing one, consider: An automatic monitor that has an arm cuff. A cuff that wraps snugly around your upper arm. You should be able to fit only one finger between your arm and the cuff. A device that stores blood pressure reading results. Do not choose a monitor that measures your blood pressure from your wrist or finger. Follow your health care provider's instructions for how to take your blood pressure. To use this form: Get one reading in the morning (a.m.) before you take any medicines. Get one reading in the evening (p.m.) before supper. Take at least 2 readings with each blood pressure check. This makes sure the results are correct. Wait 1-2 minutes between measurements. Write down the results in the spaces on this form. Repeat this once a week, or as told by your health care provider. Make a follow-up appointment with your health care provider to discuss the results. Blood pressure log Date: _______________________ a.m. _____________________(1st reading) _____________________(2nd reading) p.m. _____________________(1st  reading) _____________________(2nd reading) Date: _______________________ a.m. _____________________(1st reading) _____________________(2nd reading) p.m. _____________________(1st reading) _____________________(2nd reading) Date: _______________________ a.m. _____________________(1st reading) _____________________(2nd reading) p.m.  _____________________(1st reading) _____________________(2nd reading) Date: _______________________ a.m. _____________________(1st reading) _____________________(2nd reading) p.m. _____________________(1st reading) _____________________(2nd reading) Date: _______________________ a.m. _____________________(1st reading) _____________________(2nd reading) p.m. _____________________(1st reading) _____________________(2nd reading) This information is not intended to replace advice given to you by your health care provider. Make sure you discuss any questions you have with your health care provider. Document Revised: 05/08/2019 Document Reviewed: 05/09/2019 Elsevier Patient Education  [place].  1}  Consistently 130/80 blood pressure

## 2021-02-26 LAB — BASIC METABOLIC PANEL
BUN/Creatinine Ratio: 18 (ref 12–28)
BUN: 13 mg/dL (ref 8–27)
CO2: 26 mmol/L (ref 20–29)
Calcium: 9.5 mg/dL (ref 8.7–10.3)
Chloride: 102 mmol/L (ref 96–106)
Creatinine, Ser: 0.71 mg/dL (ref 0.57–1.00)
Glucose: 108 mg/dL — ABNORMAL HIGH (ref 70–99)
Potassium: 4.2 mmol/L (ref 3.5–5.2)
Sodium: 138 mmol/L (ref 134–144)
eGFR: 90 mL/min/{1.73_m2} (ref >59)

## 2021-03-12 ENCOUNTER — Ambulatory Visit (INDEPENDENT_AMBULATORY_CARE_PROVIDER_SITE_OTHER)
Admission: RE | Admit: 2021-03-12 | Discharge: 2021-03-12 | Disposition: A | Discharge status: Home / Self Care | Payer: No Typology Code available for payment source | Source: Ambulatory Visit | Attending: Nurse Practitioner | Admitting: Nurse Practitioner

## 2021-03-12 ENCOUNTER — Telehealth: Payer: Self-pay | Admitting: Nurse Practitioner

## 2021-03-12 ENCOUNTER — Other Ambulatory Visit: Payer: Self-pay

## 2021-03-12 ENCOUNTER — Ambulatory Visit (HOSPITAL_COMMUNITY)
Admission: RE | Admit: 2021-03-12 | Discharge: 2021-03-12 | Disposition: A | Discharge status: Home / Self Care | Payer: No Typology Code available for payment source | Source: Ambulatory Visit | Attending: Cardiovascular Disease | Admitting: Cardiovascular Disease

## 2021-03-12 DIAGNOSIS — R0989 Other specified symptoms and signs involving the circulatory and respiratory systems: Secondary | ICD-10-CM | POA: Diagnosis not present

## 2021-03-12 DIAGNOSIS — E785 Hyperlipidemia, unspecified: Secondary | ICD-10-CM | POA: Diagnosis not present

## 2021-03-12 DIAGNOSIS — I7121 Aneurysm of the ascending aorta, without rupture: Secondary | ICD-10-CM

## 2021-03-12 DIAGNOSIS — I10 Essential (primary) hypertension: Secondary | ICD-10-CM

## 2021-03-12 DIAGNOSIS — I340 Nonrheumatic mitral (valve) insufficiency: Secondary | ICD-10-CM

## 2021-03-12 DIAGNOSIS — I779 Disorder of arteries and arterioles, unspecified: Secondary | ICD-10-CM

## 2021-03-12 MED ORDER — IOHEXOL 350 MG/ML SOLN
80.0000 mL | Freq: Once | INTRAVENOUS | Status: AC | PRN
  Administered 2021-03-12: 80 mL via INTRAVENOUS

## 2021-03-12 NOTE — Telephone Encounter (Signed)
I reached out to the patient regarding concerns about her BP voiced to CT technician Perfecto Kingdom. I left a message for patient to call back to triage nurse to report concerns she is having with her BP.

## 2021-03-16 NOTE — Telephone Encounter (Signed)
Spoke with pt and was just wanting to come in and check B/P value against manual check verses pt's monitor Per pt thinks her B/P machine is reading higher than manual reading .Suggested to pt to compare with maybe the one at [facility]  or purchase another machine and compare and if needs to can return the new machine pt verbalized understanding Will forward to Christen Bame NP .Adonis Housekeeper

## 2021-04-09 ENCOUNTER — Ambulatory Visit: Payer: No Typology Code available for payment source | Admitting: Sports Medicine

## 2021-05-04 ENCOUNTER — Ambulatory Visit: Payer: No Typology Code available for payment source | Admitting: Cardiology

## 2021-06-16 ENCOUNTER — Encounter: Payer: Self-pay | Admitting: Cardiology

## 2021-07-14 ENCOUNTER — Ambulatory Visit: Payer: No Typology Code available for payment source | Admitting: Cardiology
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
## 2021-08-14 ENCOUNTER — Ambulatory Visit: Payer: No Typology Code available for payment source | Admitting: Cardiology

## 2021-08-14 ENCOUNTER — Encounter: Payer: Self-pay | Admitting: Cardiology

## 2021-08-14 VITALS — BP 144/84 | HR 66 | Ht 66.0 in | Wt 131.6 lb

## 2021-08-14 DIAGNOSIS — E785 Hyperlipidemia, unspecified: Secondary | ICD-10-CM

## 2021-08-14 DIAGNOSIS — I340 Nonrheumatic mitral (valve) insufficiency: Secondary | ICD-10-CM

## 2021-08-14 DIAGNOSIS — I77819 Aortic ectasia, unspecified site: Secondary | ICD-10-CM

## 2021-08-14 DIAGNOSIS — R202 Paresthesia of skin: Secondary | ICD-10-CM

## 2021-08-14 DIAGNOSIS — I7121 Aneurysm of the ascending aorta, without rupture: Secondary | ICD-10-CM | POA: Diagnosis not present

## 2021-08-14 DIAGNOSIS — R200 Anesthesia of skin: Secondary | ICD-10-CM

## 2021-08-14 DIAGNOSIS — I351 Nonrheumatic aortic (valve) insufficiency: Secondary | ICD-10-CM

## 2021-08-14 DIAGNOSIS — I10 Essential (primary) hypertension: Secondary | ICD-10-CM

## 2021-08-14 MED ORDER — AMLODIPINE BESYLATE 5 MG PO TABS: 5.0000 mg | ORAL_TABLET | Freq: Every day | ORAL | 3 refills | Status: DC

## 2021-08-14 NOTE — Addendum Note (Signed)
Addended by: [name] on: 08/14/2021 09:29 AM   Modules accepted: Orders
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
## 2021-08-14 NOTE — Patient Instructions (Signed)
Medication Instructions:   START TAKING AMLODIPINE 5 MG BY MOUTH DAILY  *If you need a refill on your cardiac medications before your next appointment, please call your pharmacy*   Testing/Procedures:  CT ANGIO CHEST AORTA W/WO CONTRAST--NEEDS TO BE SCHEDULED FOR FEB 2024 PER DR. Johney Frame   Your physician has requested that you have an echocardiogram. Echocardiography is a painless test that uses sound waves to create images of your heart. It provides your doctor with information about the size and shape of your heart and how well your heart's chambers and valves are working. This procedure takes approximately one hour. There are no restrictions for this procedure.  NEEDS TO BE SCHEDULED FOR MAY 2024 PER DR. Johney Frame     Follow-Up: At [facility], you and your health needs are our priority.  As part of our continuing mission to provide you with exceptional heart care, we have created designated Provider Care Teams.  These Care Teams include your primary Cardiologist (physician) and Advanced Practice Providers (APPs -  Physician Assistants and Nurse Practitioners) who all work together to provide you with the care you need, when you need it.  We recommend signing up for the patient portal called "MyChart".  Sign up information is provided on this After Visit Summary.  MyChart is used to connect with patients for Virtual Visits (Telemedicine).  Patients are able to view lab/test results, encounter notes, upcoming appointments, etc.  Non-urgent messages can be sent to your provider as well.   To learn more about what you can do with MyChart, go to [email or web address].    Your next appointment:   1 year(s)  The format for your next appointment:   In Person  Provider:   DR. Johney Frame    Important Information About Sugar

## 2021-08-17 ENCOUNTER — Other Ambulatory Visit: Payer: Self-pay

## 2021-08-17 MED ORDER — IRBESARTAN 150 MG PO TABS: 75.0000 mg | ORAL_TABLET | Freq: Every day | ORAL | 3 refills | Status: DC

## 2021-09-16 ENCOUNTER — Encounter: Payer: Self-pay | Admitting: Family

## 2021-09-16 ENCOUNTER — Ambulatory Visit: Payer: No Typology Code available for payment source | Admitting: Family

## 2021-09-16 ENCOUNTER — Encounter: Payer: No Typology Code available for payment source | Admitting: Family Medicine

## 2021-09-16 VITALS — BP 118/70 | HR 68 | Temp 98.6°F | Resp 16 | Ht 66.0 in | Wt 131.1 lb

## 2021-09-16 DIAGNOSIS — Z1231 Encounter for screening mammogram for malignant neoplasm of breast: Secondary | ICD-10-CM | POA: Insufficient documentation

## 2021-09-16 DIAGNOSIS — E559 Vitamin D deficiency, unspecified: Secondary | ICD-10-CM | POA: Insufficient documentation

## 2021-09-16 DIAGNOSIS — Z780 Asymptomatic menopausal state: Secondary | ICD-10-CM | POA: Insufficient documentation

## 2021-09-16 DIAGNOSIS — E782 Mixed hyperlipidemia: Secondary | ICD-10-CM

## 2021-09-16 DIAGNOSIS — M2550 Pain in unspecified joint: Secondary | ICD-10-CM | POA: Diagnosis not present

## 2021-09-16 DIAGNOSIS — M792 Neuralgia and neuritis, unspecified: Secondary | ICD-10-CM | POA: Diagnosis not present

## 2021-09-16 DIAGNOSIS — G8929 Other chronic pain: Secondary | ICD-10-CM

## 2021-09-16 DIAGNOSIS — I10 Essential (primary) hypertension: Secondary | ICD-10-CM | POA: Diagnosis not present

## 2021-09-16 DIAGNOSIS — R634 Abnormal weight loss: Secondary | ICD-10-CM | POA: Insufficient documentation

## 2021-09-16 DIAGNOSIS — M25562 Pain in left knee: Secondary | ICD-10-CM

## 2021-09-16 DIAGNOSIS — R739 Hyperglycemia, unspecified: Secondary | ICD-10-CM | POA: Diagnosis not present

## 2021-09-16 DIAGNOSIS — M25561 Pain in right knee: Secondary | ICD-10-CM

## 2021-09-16 NOTE — Patient Instructions (Signed)
I have sent an electronic order over to your preferred location for the following:   '[]'$   2D Mammogram  '[x]'$   3D Mammogram  '[]'$   Bone Density   Please give this center a call to get scheduled at your convenience.   '[x]'$   [facility]      [place], [place]         Make sure to wear two piece  clothing  No lotions powders or deodorants the day of the appointment Make sure to bring picture ID and insurance card.  Bring list of medications you are currently taking including any supplements.     Look at antiinflammatory diet.   Can take daily tumeric, cherry tart and or magnesium.   Welcome to our clinic, I am happy to have you as my new patient. I am excited to continue on this healthcare journey with you.  Stop by the lab prior to leaving today. I will notify you of your results once received.   Please keep in mind Any my chart messages you send have up to a three business day turnaround for a response.  Phone calls may take up to a one full business day turnaround for a  response.   If you need a medication refill I recommend you request it through the pharmacy as this is easiest for [place] rather than sending a message and or phone call.   Due to recent changes in healthcare laws, you may see results of your imaging and/or laboratory studies on MyChart before I have had a chance to review them.  I understand that in some cases there may be results that are confusing or concerning to you. Please understand that not all results are received at the same time and often I may need to interpret multiple results in order to provide you with the best plan of care or course of treatment. Therefore, I ask that you please give me 2 business days to thoroughly review all your results before contacting my office for clarification. Should we see a critical lab result, you will be contacted sooner.   It was a pleasure seeing you today!  Please do not hesitate to reach out with any questions and or concerns.  Regards,   Eugenia Pancoast FNP-C
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
## 2021-09-17 NOTE — Assessment & Plan Note (Signed)
b12 and folate and a1c ordered pending results

## 2021-09-17 NOTE — Assessment & Plan Note (Signed)
Pt advised of the following: Work on a diabetic diet, try to incorporate exercise at least 20-30 a day for 3 days a week or more.   

## 2021-09-17 NOTE — Assessment & Plan Note (Signed)
Ordering thyroid panel with tsh pending results

## 2021-09-17 NOTE — Assessment & Plan Note (Signed)
Continue amlodipine 5 mg and irbesartan 150 mg once daily (taking half daily) blood pressure remains stable.  F/u with cardiologist as scheduled.

## 2021-09-17 NOTE — Assessment & Plan Note (Signed)
Will do workup for RF and or other autoimmune disease.

## 2021-09-17 NOTE — Assessment & Plan Note (Signed)
Bone density ordered pending results.

## 2021-09-17 NOTE — Assessment & Plan Note (Signed)
Ordered vitamin d pending results.   

## 2021-09-17 NOTE — Assessment & Plan Note (Signed)
Mammogram ordered. Pending results. 

## 2021-09-17 NOTE — Assessment & Plan Note (Signed)
Lab workup pending results

## 2021-09-17 NOTE — Assessment & Plan Note (Signed)
Ordered lipid panel, pending results. Work on low cholesterol diet and exercise as tolerated ? ?

## 2021-09-18 ENCOUNTER — Telehealth: Payer: Self-pay | Admitting: Family

## 2021-09-18 LAB — COMPREHENSIVE METABOLIC PANEL
AG Ratio: 1.8 (calc) (ref 1.0–2.5)
ALT: 11 U/L (ref 6–29)
AST: 17 U/L (ref 10–35)
Albumin: 4.6 g/dL (ref 3.6–5.1)
Alkaline phosphatase (APISO): 51 U/L (ref 37–153)
BUN: 9 mg/dL (ref 7–25)
CO2: 24 mmol/L (ref 20–32)
Calcium: 9.6 mg/dL (ref 8.6–10.4)
Chloride: 100 mmol/L (ref 98–110)
Creat: 0.67 mg/dL (ref 0.60–1.00)
Globulin: 2.5 g/dL (calc) (ref 1.9–3.7)
Glucose, Bld: 96 mg/dL (ref 65–99)
Potassium: 4.4 mmol/L (ref 3.5–5.3)
Sodium: 135 mmol/L (ref 135–146)
Total Bilirubin: 0.6 mg/dL (ref 0.2–1.2)
Total Protein: 7.1 g/dL (ref 6.1–8.1)

## 2021-09-18 LAB — THYROID PANEL WITH TSH
Free Thyroxine Index: 2.2 (ref 1.4–3.8)
T3 Uptake: 28 % (ref 22–35)
T4, Total: 7.7 ug/dL (ref 5.1–11.9)
TSH: 3.39 mIU/L (ref 0.40–4.50)

## 2021-09-18 LAB — B12 AND FOLATE PANEL
Folate: >24 ng/mL
Vitamin B-12: 950 pg/mL (ref 200–1100)

## 2021-09-18 LAB — CBC
HCT: 38.9 % (ref 35.0–45.0)
Hemoglobin: 13 g/dL (ref 11.7–15.5)
MCH: 29.5 pg (ref 27.0–33.0)
MCHC: 33.4 g/dL (ref 32.0–36.0)
MCV: 88.4 fL (ref 80.0–100.0)
MPV: 11 fL (ref 7.5–12.5)
Platelets: 210 10*3/uL (ref 140–400)
RBC: 4.4 10*6/uL (ref 3.80–5.10)
RDW: 12.4 % (ref 11.0–15.0)
WBC: 4.7 10*3/uL (ref 3.8–10.8)

## 2021-09-18 LAB — ANA: Anti Nuclear Antibody (ANA): NEGATIVE

## 2021-09-18 LAB — HEMOGLOBIN A1C
Hgb A1c MFr Bld: 5.4 % of total Hgb (ref <5.7)
Mean Plasma Glucose: 108 mg/dL
eAG (mmol/L): 6 mmol/L

## 2021-09-18 LAB — RHEUMATOID FACTOR: Rheumatoid fact SerPl-aCnc: <14 IU/mL (ref <14)

## 2021-09-18 LAB — VITAMIN D 25 HYDROXY (VIT D DEFICIENCY, FRACTURES): Vit D, 25-Hydroxy: 30 ng/mL (ref 30–100)

## 2021-09-18 LAB — SEDIMENTATION RATE: Sed Rate: 2 mm/h (ref 0–30)

## 2021-09-18 NOTE — Telephone Encounter (Signed)
Called and informed pt of the information.

## 2021-09-18 NOTE — Telephone Encounter (Signed)
This is the code we use for medical screening for breast cancer. It does not mean she has breast cancer. This is for billing purposes.   Screening means looking for aka preventative services.

## 2021-09-18 NOTE — Telephone Encounter (Signed)
Patient has a question about referral for screening mammogram, she is concerned about the reason listed on her AVS (malignant neoplasm of breast)  Patient states she has never seen this before  Please follow-up with the patient at the [phone] number.

## 2022-01-15 ENCOUNTER — Telehealth: Payer: Self-pay

## 2022-01-15 ENCOUNTER — Encounter (HOSPITAL_COMMUNITY): Payer: Self-pay | Admitting: Emergency Medicine

## 2022-01-15 ENCOUNTER — Ambulatory Visit (HOSPITAL_COMMUNITY)
Admission: EM | Admit: 2022-01-15 | Discharge: 2022-01-15 | Disposition: A | Discharge status: Home / Self Care | Payer: No Typology Code available for payment source | Attending: Internal Medicine | Admitting: Internal Medicine

## 2022-01-15 DIAGNOSIS — J069 Acute upper respiratory infection, unspecified: Secondary | ICD-10-CM | POA: Diagnosis present

## 2022-01-15 DIAGNOSIS — R0989 Other specified symptoms and signs involving the circulatory and respiratory systems: Secondary | ICD-10-CM | POA: Diagnosis not present

## 2022-01-15 DIAGNOSIS — Z1152 Encounter for screening for COVID-19: Secondary | ICD-10-CM

## 2022-01-15 LAB — POC INFLUENZA A AND B ANTIGEN (URGENT CARE ONLY)
INFLUENZA A ANTIGEN, POC: NEGATIVE
INFLUENZA B ANTIGEN, POC: NEGATIVE

## 2022-01-15 LAB — RESP PANEL BY RT-PCR (FLU A&B, COVID) ARPGX2
Influenza A by PCR: POSITIVE — AB
Influenza B by PCR: NEGATIVE
SARS Coronavirus 2 by RT PCR: NEGATIVE

## 2022-01-15 MED ORDER — ACETAMINOPHEN 325 MG PO TABS
ORAL_TABLET | ORAL | Status: AC
  Filled 2022-01-15: qty 2

## 2022-01-15 MED ORDER — ACETAMINOPHEN 325 MG PO TABS
650.0000 mg | ORAL_TABLET | Freq: Once | ORAL | Status: AC
  Administered 2022-01-15: 650 mg via ORAL

## 2022-01-15 MED ORDER — DOXYCYCLINE HYCLATE 100 MG PO CAPS: 100.0000 mg | ORAL_CAPSULE | Freq: Two times a day (BID) | ORAL | 0 refills | Status: DC

## 2022-01-15 MED ORDER — ACETAMINOPHEN 500 MG PO TABS: 1000.0000 mg | ORAL_TABLET | Freq: Three times a day (TID) | ORAL | 0 refills | Status: DC | PRN

## 2022-01-15 NOTE — Telephone Encounter (Signed)
[facility] - Client TELEPHONE ADVICE RECORD [facility] Patient Name: Kathleen Thomas Gender: Female DOB: [birth year] Age: 76 Y 2 M 12 D Return Phone Number: [phone] (Primary) Address[place]  [address] Client [facility] - Client Client Site [facility] - Day Provider AA - PHYSICIAN, NOT LISTED- MD Contact Type Call Who Is Calling Patient / Member / Family / Caregiver Call Type Triage / Clinical Relationship To Patient Self Return Phone Number [phone] (Primary) Chief Complaint CHEST PAIN - pain, pressure, heaviness or tightness Reason for Call Symptomatic / Request for Health Information Initial Comment Caller states [name] with [facility]. She has a pt that is having chest congestion and a cough. No appts available today. When she coughs there is a little pain sometimes, but when she is at rest she doesn't have any pain. Translation No Nurse Assessment Nurse: Patsey Berthold, RN, Roma Kayser Date/Time Eilene Ghazi Time): 01/15/2022 3:26:36 PM Confirm and document reason for call. If symptomatic, describe symptoms. ---Caller states [name] with [facility]. She has a pt that is having chest congestion and a cough. No appts available today. When she coughs there is a little pain sometimes, but when she is at rest she doesn't have any pain. Sometimes when coughing really hard will have some pain, but not all the time, has had cough since Saturday- mucinex DM,'100mg'$  vitamin C per day. Does the patient have any new or worsening symptoms? ---Yes Will a triage be completed? ---Yes Related visit to physician within the last 2 weeks? ---No Does the PT have any chronic conditions? (i.e. diabetes, asthma, this includes High risk factors for pregnancy, etc.) ---Yes List chronic conditions. ---HTN Is this a behavioral health or substance abuse call? ---No Guidelines Guideline  Title Affirmed Question Affirmed Notes Nurse Date/Time Eilene Ghazi Time) Cough - Acute NonProductive Wheezing is present Vivi Ferns 01/15/2022 3:29:41 PM PLEASE NOTE: All timestamps contained within this report are represented as [place] Standard Time. CONFIDENTIALTY NOTICE: This fax transmission is intended only for the addressee. It contains information that is legally privileged, confidential or otherwise protected from use or disclosure. If you are not the intended recipient, you are strictly prohibited from reviewing, disclosing, copying using or disseminating any of this information or taking any action in reliance on or regarding this information. If you have received this fax in error, please notify us immediately by telephone so that we can arrange for its return to [place]. Phone: [phone], Toll-Free: [phone], Fax: [phone] Page: 2 of 2 Call Id: [id number] [place]. Time Eilene Ghazi Time) Disposition Final User 01/15/2022 3:25:29 PM Send to Urgent Queue [name], [name] Billow 01/15/2022 3:37:55 PM See HCP within 4 Hours (or PCP triage) Yes Patsey Berthold, RN, Roma Kayser Final Disposition 01/15/2022 3:37:55 PM See HCP within 4 Hours (or PCP triage) Yes Patsey Berthold, RN, Melvyn Novas Disagree/Comply Comply Caller Understands Yes PreDisposition Call Doctor Care Advice Given Per Guideline SEE HCP (OR PCP TRIAGE) WITHIN 4 HOURS: * IF OFFICE WILL BE CLOSED AND NO PCP (PRIMARY CARE PROVIDER) SECOND-LEVEL TRIAGE: You need to be seen within the next 3 or 4 hours. A nearby Urgent Care Center [facility]) is often a good source of care. Another choice is to go to the ED. Go sooner if you become worse. CALL BACK IF: * You become worse CARE ADVICE given per Cough - Acute Non-Productive (Adult) guideline. Comments User: Diana Eves, RN Date/Time Eilene Ghazi Time): 01/15/2022 3:29:35 PM When sitting up does not cough, laying  down aggravates cough. Needs to sit up. User: Diana Eves, RN  Date/Time Eilene Ghazi Time): 01/15/2022 3:30:08 PM Is using humidifier/ steam to to clear sinuses. User: Diana Eves, RN Date/Time Eilene Ghazi Time): 01/15/2022 3:30:53 PM Breathing out forcefully will hear some wheezing. Referrals [facility]

## 2022-01-15 NOTE — Telephone Encounter (Signed)
Noted. Agree with disposition of patient being seen in UC

## 2022-01-15 NOTE — Telephone Encounter (Signed)
I spoke with pt and she is waiting on someone to come pick pt up and take to [facility]. Pt is coughing and does have some raspiness to voice. And pt has noted wheezing also. No CP. Pt said her transportation should be there soon. UC & ED precautions given and pt voiced understanding, sending note to Romilda Garret NP.

## 2022-01-15 NOTE — ED Triage Notes (Signed)
Pt reports cough and congestion x 1 week. States sometimes she has chills, Has been taking Mucinex DM and 1000 mg of Vitamin C. States she also has pain when coughing.

## 2022-01-15 NOTE — ED Provider Notes (Signed)
[facility]    CSN: [id number] Arrival date & time: 01/15/22  1646      History   Chief Complaint Chief Complaint  Patient presents with   Cough   Nasal Congestion    HPI Kathleen Thomas is a 76 y.o. female.   Patient here today for evaluation of cough and congestion she has had for the last week.  She reports that she has had some fever throughout illness but fever has seemed to worsen over the last few days.  She denies any vomiting but has had 1 episode of diarrhea.  She has tried taking Mucinex without resolution. She denies any sore throat. She reports cough is  minimally productive.   The history is provided by the patient.  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   EGD - not ablated, not bleeing was incidental   Cherry angioma    Depression    recurrent   Dermatitis    Hallux valgus (acquired), left foot    with bunions   Hammer toes of both feet    Helicobacter pylori infection    Hyperlipidemia    Hypertension    Leukopenia    mild   Mitral regurgitation    mild   Osteopenia    Osteoporosis    Pure hypercholesterolemia    Scoliosis    Seborrheic keratoses    Vitamin D deficiency     Patient Active Problem List   Diagnosis Date Noted   Primary hypertension 09/16/2021   Neuralgia 09/16/2021   Polyarthralgia 09/16/2021   Hyperglycemia 09/16/2021   Encounter for screening mammogram for malignant neoplasm of breast 09/16/2021   Postmenopausal 09/16/2021   Vitamin D deficiency 09/16/2021   Abnormal weight loss 09/16/2021   Personal history of colonic polyps 02/27/2020   Hyperlipidemia 02/27/2020   Insomnia 02/27/2020   Stress due to illness of family member 02/27/2020   Bilateral  chronic knee pain 05/25/2019   Conductive hearing loss 04/08/2017   PVCs (premature ventricular contractions) 01/22/2011   Mitral regurgitation    Aortic insufficiency     Past Surgical History:  Procedure Laterality Date   BUNIONECTOMY Left    childbirth     x2   COLONOSCOPY     TONSILLECTOMY     TUBAL LIGATION      OB History   No obstetric history on file.      Home Medications    Prior to Admission medications   Medication Sig Start Date End Date Taking? Authorizing Provider  acetaminophen (TYLENOL) 500 MG tablet Take 2 tablets (1,000 mg total) by mouth every 8 (eight) hours as needed. 01/15/22  Yes Francene Finders, PA-C  doxycycline (VIBRAMYCIN) 100 MG capsule Take 1 capsule (100 mg total) by mouth 2 (two) times daily. 01/15/22  Yes Francene Finders, PA-C  amLODipine (NORVASC) 5 MG tablet Take 1 tablet (5 mg total) by mouth daily. 08/14/21   Freada Bergeron, MD  Cholecalciferol (VITAMIN D3) 25 MCG (1000 UT) CAPS Take 1,000 Units by mouth daily.    [provider]  irbesartan (AVAPRO) 150 MG tablet Take 0.5 tablets (75 mg total) by mouth daily. Patient takes 1/2 tablet by mouth (75 mg  tablet) once daily 08/17/21   Freada Bergeron, MD  Magnesium Ascorbate POWD Patient takes 1 spoon of powder in liquid once daily    [provider]  Multiple Vitamins-Minerals (OSTEO COMPLEX PO) Take 2 tablets by mouth daily. Boswellia, ginger, hyaluronic acid supplement    [provider]    Family History Family History  Problem Relation Age of Onset   Atrial fibrillation Mother    Congestive Heart Failure Mother    Congestive Heart Failure Father    Stomach cancer Paternal Grandfather    Epilepsy Son        mild, has hx of seizure   Alcohol abuse Son    Colon polyps Neg Hx    Colon cancer Neg Hx    Esophageal cancer Neg Hx    Rectal cancer Neg Hx     Social History Social History   Tobacco Use   Smoking status: Never   Smokeless tobacco:  Never  Vaping Use   Vaping Use: Never used  Substance Use Topics   Alcohol use: Never   Drug use: Never     Allergies   Actonel [risedronate sodium] and Fosamax [alendronate sodium]   Review of Systems Review of Systems  Constitutional:  Positive for chills and fever.  HENT:  Positive for congestion. Negative for ear pain and sore throat.   Eyes:  Negative for discharge and redness.  Respiratory:  Positive for cough. Negative for wheezing.   Gastrointestinal:  Positive for diarrhea (resolved). Negative for nausea and vomiting.     Physical Exam Triage Vital Signs ED Triage Vitals  Enc Vitals Group     BP 01/15/22 1815 135/81     Pulse Rate 01/15/22 1815 89     Resp 01/15/22 1815 18     Temp 01/15/22 1815 (!) 100.4 F (38 C)     Temp Source 01/15/22 1815 Oral     SpO2 01/15/22 1815 100 %     Weight --      Height --      Head Circumference --      Peak Flow --      Pain Score 01/15/22 1812 0     Pain Loc --      Pain Edu? --      Excl. in [place]? --    No data found.  Updated Vital Signs BP 135/81 (BP Location: Right Arm)   Pulse 89   Temp (!) 100.4 F (38 C) (Oral)   Resp 18   SpO2 100%       Physical Exam Vitals and nursing note reviewed.  Constitutional:      General: She is not in acute distress.    Appearance: Normal appearance. She is not ill-appearing.  HENT:     Head: Normocephalic and atraumatic.     Nose: Congestion present.     Mouth/Throat:     Mouth: Mucous membranes are moist.     Pharynx: No oropharyngeal exudate or posterior oropharyngeal erythema.  Eyes:     Conjunctiva/sclera: Conjunctivae normal.  Cardiovascular:     Rate and Rhythm: Normal rate and regular rhythm.     Heart sounds: Normal heart sounds. No murmur heard. Pulmonary:     Effort: Pulmonary effort is normal. No respiratory distress.     Breath sounds: Rhonchi (minimal rhonchi appreciated to right lower lung field) present. No wheezing or rales.  Skin:    General:  Skin is warm and dry.  Neurological:     Mental  Status: She is alert.  Psychiatric:        Mood and Affect: Mood normal.        Thought Content: Thought content normal.      UC Treatments / Results  Labs (all labs ordered are listed, but only abnormal results are displayed) Labs Reviewed  RESP PANEL BY RT-PCR (FLU A&B, COVID) [id number]  POC INFLUENZA A AND B ANTIGEN (URGENT CARE ONLY)    EKG   Radiology No results found.  Procedures Procedures (including critical care time)  Medications Ordered in UC Medications  acetaminophen (TYLENOL) tablet 650 mg (650 mg Oral Given 01/15/22 1838)    Initial Impression / Assessment and Plan / UC Course  I have reviewed the triage vital signs and the nursing notes.  Pertinent labs & imaging results that were available during my care of the patient were reviewed by me and considered in my medical decision making (see chart for details).    Rapid flu test negative in office.  Will order COVID and flu PCR screening.  Doxycycline prescribed given abnormal lung sounds on exam and worsening symptoms and fever after weeklong history of cough.  Patient requested rx for tylenol. Recommended continued symptomatic treatment while awaiting results.  Encouraged follow-up if symptoms fail to improve or worsen in any way.  Final Clinical Impressions(s) / UC Diagnoses   Final diagnoses:  Acute upper respiratory infection  Encounter for screening for COVID-19  Abnormal lung sounds   Discharge Instructions   None    ED Prescriptions     Medication Sig Dispense Auth. Provider   doxycycline (VIBRAMYCIN) 100 MG capsule Take 1 capsule (100 mg total) by mouth 2 (two) times daily. 20 capsule Ewell Poe F, PA-C   acetaminophen (TYLENOL) 500 MG tablet Take 2 tablets (1,000 mg total) by mouth every 8 (eight) hours as needed. 30 tablet Francene Finders, PA-C      PDMP not reviewed this encounter.   Francene Finders, PA-C 01/15/22 1947

## 2022-01-19 ENCOUNTER — Encounter: Payer: Self-pay | Admitting: Family Medicine

## 2022-01-19 ENCOUNTER — Ambulatory Visit: Payer: No Typology Code available for payment source | Admitting: Family Medicine

## 2022-01-19 VITALS — BP 120/80 | HR 59 | Temp 97.9°F | Ht 66.0 in | Wt 126.4 lb

## 2022-01-19 DIAGNOSIS — F411 Generalized anxiety disorder: Secondary | ICD-10-CM | POA: Diagnosis not present

## 2022-01-19 MED ORDER — SERTRALINE HCL 25 MG PO TABS: 25.0000 mg | ORAL_TABLET | Freq: Every day | ORAL | 3 refills | Status: DC

## 2022-01-19 NOTE — Progress Notes (Signed)
Patient ID: Kathleen Thomas, female    DOB: 04/24/1945, 76 y.o.   MRN: [id number]  This visit was conducted in person.  BP 120/80   Pulse (!) 59   Temp 97.9 F (36.6 C) (Oral)   Ht '5\' 6"'$  (1.676 m)   Wt 126 lb 6 oz (57.3 kg)   SpO2 96%   BMI 20.40 kg/m    CC: Chief Complaint  Patient presents with   Anxiety    Subjective:   HPI: Kathleen Thomas is a 76 y.o. female  patient of Eugenia Pancoast, NP with history of HTN, insomnia presenting on 01/19/2022 for Anxiety  She reports she has been feeling nervous and anxious... depressed, tearful in office today. She feels hopeless and not sure what to do.  Considering moving closer to sisters. Other child daughter lives further away and not  very helpful.   Son lives with her.. drinking issues. She has had increase in stress in last 3 months.  Her husband is unhealthy in nursing home.  PHQ9: 2 GAD7:9  She also currently has influenza A. Per pt currently treated by urgent care.     Relevant past medical, surgical, family and social history reviewed and updated as indicated. Interim medical history since our last visit reviewed. Allergies and medications reviewed and updated. Outpatient Medications Prior to Visit  Medication Sig Dispense Refill   acetaminophen (TYLENOL) 500 MG tablet Take 2 tablets (1,000 mg total) by mouth every 8 (eight) hours as needed. 30 tablet 0   amLODipine (NORVASC) 5 MG tablet Take 1 tablet (5 mg total) by mouth daily. 90 tablet 3   Cholecalciferol (VITAMIN D3) 25 MCG (1000 UT) CAPS Take 1,000 Units by mouth daily.     doxycycline (VIBRAMYCIN) 100 MG capsule Take 1 capsule (100 mg total) by mouth 2 (two) times daily. 20 capsule 0   irbesartan (AVAPRO) 150 MG tablet Take 0.5 tablets (75 mg total) by mouth daily. Patient takes 1/2 tablet by mouth (75 mg tablet) once daily 90 tablet 3   Magnesium Ascorbate POWD Patient takes 1 spoon of powder in liquid once daily     Multiple Vitamins-Minerals (OSTEO COMPLEX  PO) Take 2 tablets by mouth daily. Boswellia, ginger, hyaluronic acid supplement     No facility-administered medications prior to visit.     Per HPI unless specifically indicated in ROS section below Review of Systems  Constitutional:  Negative for fatigue and fever.  HENT:  Negative for congestion.   Eyes:  Negative for pain.  Respiratory:  Positive for cough. Negative for shortness of breath.   Cardiovascular:  Negative for chest pain, palpitations and leg swelling.  Gastrointestinal:  Negative for abdominal pain.  Genitourinary:  Negative for dysuria and vaginal bleeding.  Musculoskeletal:  Negative for back pain.  Neurological:  Negative for syncope, light-headedness and headaches.  Psychiatric/Behavioral:  Negative for dysphoric mood.    Objective:  BP 120/80   Pulse (!) 59   Temp 97.9 F (36.6 C) (Oral)   Ht '5\' 6"'$  (1.676 m)   Wt 126 lb 6 oz (57.3 kg)   SpO2 96%   BMI 20.40 kg/m   Wt Readings from Last 3 Encounters:  01/19/22 126 lb 6 oz (57.3 kg)  09/16/21 131 lb 2 oz (59.5 kg)  08/14/21 131 lb 9.6 oz (59.7 kg)      Physical Exam Constitutional:      General: She is not in acute distress.    Appearance: Normal appearance. She is  well-developed. She is not ill-appearing or toxic-appearing.  HENT:     Head: Normocephalic.     Right Ear: Hearing, tympanic membrane, ear canal and external ear normal. Tympanic membrane is not erythematous, retracted or bulging.     Left Ear: Hearing, tympanic membrane, ear canal and external ear normal. Tympanic membrane is not erythematous, retracted or bulging.     Nose: No mucosal edema or rhinorrhea.     Right Sinus: No maxillary sinus tenderness or frontal sinus tenderness.     Left Sinus: No maxillary sinus tenderness or frontal sinus tenderness.     Mouth/Throat:     Pharynx: Uvula midline.  Eyes:     General: Lids are normal. Lids are everted, no foreign bodies appreciated.     Conjunctiva/sclera: Conjunctivae normal.      Pupils: Pupils are equal, round, and reactive to light.  Neck:     Thyroid: No thyroid mass or thyromegaly.     Vascular: No carotid bruit.     Trachea: Trachea normal.  Cardiovascular:     Rate and Rhythm: Normal rate and regular rhythm.     Pulses: Normal pulses.     Heart sounds: Normal heart sounds, S1 normal and S2 normal. No murmur heard.    No friction rub. No gallop.  Pulmonary:     Effort: Pulmonary effort is normal. No tachypnea or respiratory distress.     Breath sounds: Normal breath sounds. No decreased breath sounds, wheezing, rhonchi or rales.  Abdominal:     General: Bowel sounds are normal.     Palpations: Abdomen is soft.     Tenderness: There is no abdominal tenderness.  Musculoskeletal:     Cervical back: Normal range of motion and neck supple.  Skin:    General: Skin is warm and dry.     Findings: No rash.  Neurological:     Mental Status: She is alert.  Psychiatric:        Mood and Affect: Mood is depressed. Mood is not anxious. Affect is tearful.        Speech: Speech normal.        Behavior: Behavior normal. Behavior is cooperative.        Thought Content: Thought content normal.        Judgment: Judgment normal.       Results for orders placed or performed during the hospital encounter of 01/15/22  Resp Panel by RT-PCR (Flu A&B, Covid) Anterior Nasal Swab   Specimen: Anterior Nasal Swab  Result Value Ref Range   SARS Coronavirus 2 by RT PCR NEGATIVE NEGATIVE   Influenza A by PCR POSITIVE (A) NEGATIVE   Influenza B by PCR NEGATIVE NEGATIVE  POC Influenza A & B Ag (Urgent Care)  Result Value Ref Range   INFLUENZA A ANTIGEN, POC NEGATIVE NEGATIVE   INFLUENZA B ANTIGEN, POC NEGATIVE NEGATIVE     COVID 19 screen:  No recent travel or known exposure to COVID19 The patient denies respiratory symptoms of COVID 19 at this time. The importance of social distancing was discussed today.   Assessment and Plan Problem List Items Addressed This Visit      GAD (generalized anxiety disorder) - Primary    Acute, recommended counseling.  She plans on calling [facility] counseling as recommended by her friend on her own.  Discussed home issues in detail with patient.  Start sertraline 25 mg po qHS. Discussed medication use, side effects and expected course. Follow-up in 4 weeks for reevaluation.  Relevant Medications   sertraline (ZOLOFT) 25 MG tablet       Eliezer Lofts, MD

## 2022-01-19 NOTE — Patient Instructions (Signed)
Call restoration counseling to set up a first visit.  Start sertraline 25 mg daily at night.

## 2022-01-19 NOTE — Assessment & Plan Note (Signed)
Acute, recommended counseling.  She plans on calling restoration counseling as recommended by her friend on her own.  Discussed home issues in detail with patient.  Start sertraline 25 mg po qHS. Discussed medication use, side effects and expected course. Follow-up in 4 weeks for reevaluation.

## 2022-01-20 ENCOUNTER — Telehealth: Payer: Self-pay | Admitting: Family

## 2022-01-20 NOTE — Telephone Encounter (Signed)
Patient was seen at [facility] on Friday and was diagnosed with type A flu. She was placed on an antibiotic,that she has not finished yet,but she states that she has a lingering cough that she would ,like to know if [facility] could call in some medication for? She was seen on 12/19 by [facility] as well for anxiety.

## 2022-01-20 NOTE — Telephone Encounter (Signed)
Left message to return call to our office.  

## 2022-01-20 NOTE — Telephone Encounter (Signed)
It looks like she began Doxycycline per the Urgent Care on 01/15/22 and the Zoloft yesterday or today.   Did her symptoms start with the Doxycycline on 01/15/22 or today?  How is her cough and other sick symptoms?

## 2022-01-20 NOTE — Telephone Encounter (Signed)
Called and informed pt of provider instructions.

## 2022-01-20 NOTE — Telephone Encounter (Signed)
Her symptoms started today. She is feeling for nervous, and her other symptoms are doing better she still has a slight cough.

## 2022-01-20 NOTE — Telephone Encounter (Signed)
Pt called in requesting a call back stated she been having nausea and weakness since she started taking RXdoxycycline (VIBRAMYCIN) 100 MG capsule . Also wants to know if the combination of her other medication sertraline (ZOLOFT) 25 MG tablet  could be the cause . Please advise [phone]

## 2022-01-20 NOTE — Telephone Encounter (Signed)
Call  If no SOB.. I can call in daytime or nighttime cough suppressant.  Benzonatate or  codeine guaifenesin syrup.Marland Kitchen let me know pt preference.

## 2022-01-20 NOTE — Telephone Encounter (Signed)
Recommend she stop the Zoloft until she finishes the Doxycycline antibiotics and completely recovers from her symptoms.  When she resumes Zoloft, have her start with 1/2 tablet for 1-2 weeks, then increase to 1 full tablet thereafter.

## 2022-01-21 NOTE — Telephone Encounter (Signed)
Ms. Rudean Haskell notified as instructed by telephone.  She states the cough is better and since she is on an antibiotic she wants to hold off on the cough medicine for now.  Nothing further is needed at this time. FYI to Dr. Diona Browner.

## 2022-02-05 NOTE — Telephone Encounter (Signed)
Pt called stated she's still have an ongoing cough . Would like something called [facility] to [facility] [place] [address] pt is out of town . Please advise # [phone]

## 2022-02-05 NOTE — Telephone Encounter (Signed)
Please advise, thank you.

## 2022-02-05 NOTE — Telephone Encounter (Signed)
Spoke with patient and gave recommendations. She will consider UC, or possible OV here when she returns to town. Nothing further needed at this time.

## 2022-02-05 NOTE — Telephone Encounter (Signed)
Pt has not been since 12/19 in office. Will need to be seen in office to reassess otherwise recommend urgent care if out of town.

## 2022-02-10 ENCOUNTER — Telehealth: Payer: Self-pay | Admitting: Family

## 2022-02-10 ENCOUNTER — Other Ambulatory Visit: Payer: Self-pay | Admitting: Family Medicine

## 2022-02-10 NOTE — Telephone Encounter (Signed)
Can we see why pt is f/u with Dr. Diona Browner? Can we get her on my schedule instead? She is a recent TOC for me from Dr. Einar Pheasant.

## 2022-02-10 NOTE — Telephone Encounter (Signed)
Patient was prescribedsertraline (ZOLOFT) 25 MG tablet  at her last appointment for her mood. She called in today stating that she has not been able to take the medication due to a family situation. She would like to know should she still comer in for her appointment on the 16th? Since it was a f/u to see how she is doing on this medication?

## 2022-02-11 NOTE — Telephone Encounter (Signed)
Kathleen Thomas notified as instructed by telephone.  Patient states understanding.  Appointment on 02/16/22 cancelled.

## 2022-02-11 NOTE — Telephone Encounter (Signed)
Call  If she has not yet started the sertraline she does need to not need to follow-up. If she plans to start the sertraline please have her reschedule for follow-up in 4 weeks after start with myself or Tabitha (PCP)

## 2022-02-15 NOTE — Telephone Encounter (Signed)
Called and switched patient to be on Kathleen Thomas's schedule. She saw Dr. Diona Browner when Lawerance Bach had taken a leave of absence and thought she was suppose to follow up with [facility] still.

## 2022-02-16 ENCOUNTER — Ambulatory Visit: Payer: No Typology Code available for payment source | Admitting: Family Medicine

## 2022-03-15 ENCOUNTER — Ambulatory Visit (HOSPITAL_BASED_OUTPATIENT_CLINIC_OR_DEPARTMENT_OTHER)
Admission: RE | Admit: 2022-03-15 | Discharge: 2022-03-15 | Disposition: A | Discharge status: Home / Self Care | Payer: No Typology Code available for payment source | Source: Ambulatory Visit | Attending: Cardiology | Admitting: Cardiology

## 2022-03-15 ENCOUNTER — Encounter (HOSPITAL_BASED_OUTPATIENT_CLINIC_OR_DEPARTMENT_OTHER): Payer: Self-pay

## 2022-03-15 DIAGNOSIS — I77819 Aortic ectasia, unspecified site: Secondary | ICD-10-CM | POA: Diagnosis present

## 2022-03-15 DIAGNOSIS — I7121 Aneurysm of the ascending aorta, without rupture: Secondary | ICD-10-CM | POA: Diagnosis present

## 2022-03-15 LAB — POCT I-STAT CREATININE: Creatinine, Ser: 0.7 mg/dL (ref 0.44–1.00)

## 2022-03-15 MED ORDER — IOHEXOL 350 MG/ML SOLN
100.0000 mL | Freq: Once | INTRAVENOUS | Status: AC | PRN
  Administered 2022-03-15: 65 mL via INTRAVENOUS

## 2022-03-16 ENCOUNTER — Ambulatory Visit: Payer: No Typology Code available for payment source | Admitting: Family Medicine

## 2022-03-16 ENCOUNTER — Ambulatory Visit: Payer: No Typology Code available for payment source | Admitting: Family

## 2022-03-16 ENCOUNTER — Encounter: Payer: Self-pay | Admitting: Family

## 2022-03-16 VITALS — BP 142/82 | HR 78 | Temp 98.2°F | Ht 66.0 in | Wt 124.2 lb

## 2022-03-16 DIAGNOSIS — I10 Essential (primary) hypertension: Secondary | ICD-10-CM

## 2022-03-16 DIAGNOSIS — F411 Generalized anxiety disorder: Secondary | ICD-10-CM | POA: Diagnosis not present

## 2022-03-16 NOTE — Progress Notes (Signed)
Established Patient Office Visit  Subjective:      CC:  Chief Complaint  Patient presents with   Medical Management of Chronic Issues    HPI: Kathleen Thomas is a 77 y.o. female presenting on 03/16/2022 for Medical Management of Chronic Issues . HTN: states stopped her blood pressure since last visit. She went out of town for about 3.5 weeks with her daughter in [place] and she forgot the take the medication with her. She has not really been checking her blood pressure at home. Repeat in office 132/82  Increased anxiety: started January 14th sertraline 25 mg once daily. Feels as though she is sleeping a bit better since starting. She thinks the hardest part is resolving the stress. She [place] app[place] [facility] [place] to consult.   Increased stressors: son was living at home trying to stay sober. Let him come home in September. At times was still coming home intoxicated and hateful which increases her stressors at home. She has since moved away from him dec 20th   Also husband at a nursing home with physical/mental illness.   Wt Readings from Last 3 Encounters:  03/16/22 124 lb 3.2 oz (56.3 kg)  01/19/22 126 lb 6 oz (57.3 kg)  09/16/21 131 lb 2 oz (59.5 kg)        Social history:  Relevant past medical, surgical, family and social history reviewed and updated as indicated. Interim medical history since our last visit reviewed.  Allergies and medications reviewed and updated.  DATA REVIEWED: CHART IN EPIC     ROS: Negative unless specifically indicated above in HPI.    Current Outpatient Medications:    Cholecalciferol (VITAMIN D3) 25 MCG (1000 UT) CAPS, Take 1,000 Units by mouth daily., Disp: , Rfl:    ketorolac (ACULAR) 0.5 % ophthalmic solution, Place 1 drop into the right eye 4 (four) times daily., Disp: , Rfl:    Magnesium Ascorbate POWD, Patient takes 1 spoon of powder in liquid once daily, Disp: , Rfl:    Multiple Vitamins-Minerals (OSTEO  COMPLEX PO), Take 2 tablets by mouth daily. Boswellia, ginger, hyaluronic acid supplement, Disp: , Rfl:    ofloxacin (OCUFLOX) 0.3 % ophthalmic solution, , Disp: , Rfl:    prednisoLONE acetate (PRED FORTE) 1 % ophthalmic suspension, Place 1 drop into the right eye 4 (four) times daily., Disp: , Rfl:    sertraline (ZOLOFT) 25 MG tablet, TAKE 1 TABLET (25 MG TOTAL) BY MOUTH DAILY., Disp: 90 tablet, Rfl: 2      Objective:    BP (!) 142/82 Comment: left  Pulse 78   Temp 98.2 F (36.8 C) (Temporal)   Ht 5' 6"$  (1.676 m)   Wt 124 lb 3.2 oz (56.3 kg)   SpO2 98%   BMI 20.05 kg/m   Wt Readings from Last 3 Encounters:  03/16/22 124 lb 3.2 oz (56.3 kg)  01/19/22 126 lb 6 oz (57.3 kg)  09/16/21 131 lb 2 oz (59.5 kg)    Physical Exam  Physical Exam Constitutional:      General: not in acute distress.    Appearance: Normal appearance. normal weight. is not ill-appearing, toxic-appearing or diaphoretic.  Cardiovascular:     Rate and Rhythm: Normal rate.  Pulmonary:     Effort: Pulmonary effort is normal.  Musculoskeletal:        General: Normal range of motion.  Neurological:     General: No focal deficit present.     Mental Status: alert  and oriented to person, place, and time. Mental status is at baseline.  Psychiatric:        Mood and Affect: Mood normal.        Behavior: Behavior normal.        Thought Content: Thought content normal.        Judgment: Judgment normal.        Assessment & Plan:  GAD (generalized anxiety disorder) Assessment & Plan: Improving slightly with sertraline 25 mg  Did advise pt if she would like can increase to 50 mg once daily Continue f/u /consult with therapy    Primary hypertension Assessment & Plan: Ok to stay off of the medication for now.        Return in about 2 months (around 05/15/2022) for f/u anxiety.  Eugenia Pancoast, MSN, APRN, FNP-C [place]

## 2022-03-16 NOTE — Patient Instructions (Addendum)
  Follow up with therapist.  Keep an eye on blood pressure over the next few weeks, report to me if consistently >130/80.  Ok to stay off of blood pressure medication for now.   You can increase sertraline to 50 mg if you would like, however you can stay at 25 mg once daily.   Try some voltaren gel for your leg as well as continue with heat to knee as needed.   Look at different apps for meditation, such as mindfulness app and or balance. Free apps are fine for this as well.    Regards,   Eugenia Pancoast FNP-C

## 2022-03-16 NOTE — Assessment & Plan Note (Signed)
Improving slightly with sertraline 25 mg  Did advise pt if she would like can increase to 50 mg once daily Continue f/u /consult with therapy

## 2022-03-16 NOTE — Assessment & Plan Note (Signed)
Ok to stay off of the medication for now.

## 2022-04-02 ENCOUNTER — Telehealth: Payer: Self-pay | Admitting: Family

## 2022-04-02 NOTE — Telephone Encounter (Signed)
Pt called in request a call back stated she would like to lower the dosage on RX sertraline (ZOLOFT) 25 MG tablet  because of side effect . Please advise [phone]

## 2022-04-05 ENCOUNTER — Other Ambulatory Visit: Payer: Self-pay

## 2022-04-05 DIAGNOSIS — I77819 Aortic ectasia, unspecified site: Secondary | ICD-10-CM

## 2022-04-05 DIAGNOSIS — I351 Nonrheumatic aortic (valve) insufficiency: Secondary | ICD-10-CM

## 2022-04-05 DIAGNOSIS — I7121 Aneurysm of the ascending aorta, without rupture: Secondary | ICD-10-CM

## 2022-04-05 NOTE — Telephone Encounter (Signed)
Patient called back and states that she felt better over the weekend taking the Sertraline. She said her heart was racing at first, but she had a lot going on and it could've been her anxiety. Patient wishes to stay on this medication dose for now, and see how it continues to work for her. She did say that she has restarted Amlodipine '5mg'$  and Irbesartan '75mg'$  daily.

## 2022-04-05 NOTE — Telephone Encounter (Signed)
Left message to return call to our office.  

## 2022-04-06 NOTE — Telephone Encounter (Signed)
Noted. Glad to hear

## 2022-05-14 ENCOUNTER — Other Ambulatory Visit: Payer: Self-pay | Admitting: Family

## 2022-05-14 NOTE — Telephone Encounter (Signed)
Patient called back and she is taking the 50mg  updated script request. Ok to refill?

## 2022-05-14 NOTE — Telephone Encounter (Signed)
Prescription Request  05/14/2022  LOV: 03/16/2022  What is the name of the medication or equipment?  sertraline (ZOLOFT) 25 MG tablet  Have you contacted your pharmacy to request a refill? Yes   Which pharmacy would you like this sent to?  [id number], [place] - [address] [place] [address] Phone: [phone] Fax: [phone]    Patient notified that their request is being sent to the clinical staff for review and that they should receive a response within 2 business days.   Please advise at [facility] [phone]  Patient was switched to 50 mg at her last visit,she would need the new script sent in to the pharmacy with 50 mg,not 25.

## 2022-05-14 NOTE — Telephone Encounter (Signed)
Patient has follow up on Monday ok to refill? Refill is for 25mg  but last note she was going to increase to 50mg  and follow up In 2 months. I have called patent to verify if she is on 25 or 50 but was not able to reach. Left message to call office and will send my chart to patient to see if we can reach that way

## 2022-05-17 ENCOUNTER — Ambulatory Visit: Payer: No Typology Code available for payment source | Admitting: Family

## 2022-05-17 MED ORDER — SERTRALINE HCL 50 MG PO TABS: 50.0000 mg | ORAL_TABLET | Freq: Every day | ORAL | 0 refills | Status: DC

## 2022-05-20 ENCOUNTER — Encounter: Payer: Self-pay | Admitting: Family

## 2022-05-20 ENCOUNTER — Ambulatory Visit: Payer: No Typology Code available for payment source | Admitting: Family

## 2022-05-20 VITALS — BP 136/84 | HR 90 | Temp 98.1°F | Ht 66.0 in | Wt 123.0 lb

## 2022-05-20 DIAGNOSIS — F411 Generalized anxiety disorder: Secondary | ICD-10-CM | POA: Diagnosis not present

## 2022-05-20 DIAGNOSIS — R634 Abnormal weight loss: Secondary | ICD-10-CM

## 2022-05-20 DIAGNOSIS — I10 Essential (primary) hypertension: Secondary | ICD-10-CM | POA: Diagnosis not present

## 2022-05-20 MED ORDER — SERTRALINE HCL 50 MG PO TABS: 50.0000 mg | ORAL_TABLET | Freq: Every day | ORAL | 3 refills | Status: DC

## 2022-05-20 NOTE — Assessment & Plan Note (Signed)
Improving Continue with therapist as scheduled Continue sertraline 50 mg once daily

## 2022-05-20 NOTE — Progress Notes (Signed)
Established Patient Office Visit  Subjective:      CC:  Chief Complaint  Patient presents with   Medical Management of Chronic Issues    HPI: Kathleen Thomas is a 77 y.o. female presenting on 05/20/2022 for Medical Management of Chronic Issues . GAD: has since increased sertraline to 50 mg once daily, she states that she is feeling slightly better since starting this but still working through her current situation.  Circumstances have changed a bit, now seeing a Librarian, academic a few times a week she is currently going to a [facility] therapy center in .   HTN: doing well, today blood pressure 136/84 on amlodipine 5 mg once daily as well as irbesartan 75 mg once daily.    Weight loss, still losing some weight. Lost 1 pound since two months ago.  Wt Readings from Last 3 Encounters:  05/20/22 123 lb (55.8 kg)  03/16/22 124 lb 3.2 oz (56.3 kg)  01/19/22 126 lb 6 oz (57.3 kg)   Breakfast: egg with spinach typically vs whole wheat bread with jam  Lunch: vegetables sometimes with chicken , salads  Dinner: vegetables with chicken at night time as well      Social history:  Relevant past medical, surgical, family and social history reviewed and updated as indicated. Interim medical history since our last visit reviewed.  Allergies and medications reviewed and updated.  DATA REVIEWED: CHART IN EPIC     ROS: Negative unless specifically indicated above in HPI.    Current Outpatient Medications:    amLODipine (NORVASC) 5 MG tablet, Take 5 mg by mouth daily., Disp: , Rfl:    Cholecalciferol (VITAMIN D3) 25 MCG (1000 UT) CAPS, Take 1,000 Units by mouth daily., Disp: , Rfl:    irbesartan (AVAPRO) 75 MG tablet, Take 75 mg by mouth., Disp: , Rfl:    sertraline (ZOLOFT) 50 MG tablet, Take 1 tablet (50 mg total) by mouth daily., Disp: 90 tablet, Rfl: 3      Objective:    BP 136/84 (BP Location: Right Arm) Comment: manual  Pulse 90   Temp 98.1 F (36.7 C)  (Temporal)   Ht  (1.676 m)   Wt 123 lb (55.8 kg)   SpO2 98%   BMI 19.85 kg/m   Wt Readings from Last 3 Encounters:  05/20/22 123 lb (55.8 kg)  03/16/22 124 lb 3.2 oz (56.3 kg)  01/19/22 126 lb 6 oz (57.3 kg)    Physical Exam  Physical Exam Constitutional:      General: not in acute distress.    Appearance: Normal appearance. normal weight. is not ill-appearing, toxic-appearing or diaphoretic.  Cardiovascular:     Rate and Rhythm: Normal rate.  Pulmonary:     Effort: Pulmonary effort is normal.  Musculoskeletal:        General: Normal range of motion.  Neurological:     General: No focal deficit present.     Mental Status: alert and oriented to person, place, and time. Mental status is at baseline.  Psychiatric:        Mood and Affect: Mood normal.        Behavior: Behavior normal.        Thought Content: Thought content normal.        Judgment: Judgment normal.        Assessment & Plan:  GAD (generalized anxiety disorder) Assessment & Plan: Improving Continue with therapist as scheduled Continue sertraline 50 mg once daily   Orders: -  Sertraline HCl; Take 1 tablet (50 mg total) by mouth daily.  Dispense: 90 tablet; Refill: 3  Primary hypertension Assessment & Plan: Stable  Continue medications as prescribed.     Abnormal weight loss Assessment & Plan: Discussed with her to increase protein in diet and adding in some carbs  Discussed with her eating more balanced meals       Return in about 6 months (around 11/19/2022) for f/u anxiety, f/u CPE.  Mort Sawyers, MSN, APRN, FNP-C [facility]

## 2022-05-20 NOTE — Patient Instructions (Signed)
   Regards,   Aidden Markovic FNP-C  

## 2022-05-20 NOTE — Assessment & Plan Note (Signed)
Stable Continue medications as prescribed. 

## 2022-05-20 NOTE — Assessment & Plan Note (Addendum)
Discussed with her to increase protein in diet and adding in some carbs  Discussed with her eating more balanced meals

## 2022-05-27 ENCOUNTER — Ambulatory Visit: Payer: No Typology Code available for payment source | Admitting: Family

## 2022-05-27 ENCOUNTER — Encounter: Payer: Self-pay | Admitting: Family

## 2022-05-27 VITALS — BP 118/64 | HR 84 | Temp 98.7°F | Ht 66.0 in | Wt 125.6 lb

## 2022-05-27 DIAGNOSIS — L02212 Cutaneous abscess of back [any part, except buttock]: Secondary | ICD-10-CM | POA: Diagnosis not present

## 2022-05-27 MED ORDER — DOXYCYCLINE HYCLATE 100 MG PO TABS: 100.0000 mg | ORAL_TABLET | Freq: Two times a day (BID) | ORAL | 0 refills | Status: AC

## 2022-05-27 NOTE — Assessment & Plan Note (Addendum)
RX doxycycline 100 mg po bid x 10 days Pt advised to:  Please monitor site for worsening signs/symptoms of infection to include: increasing redness, increasing tenderness, increase in size, and or pustulant drainage from site. If this is to occur please let me know immediately.   Advised to f/u one week will assess need for possible I&D

## 2022-05-27 NOTE — Patient Instructions (Signed)
  Please monitor site for worsening signs/symptoms of infection to include: increasing redness, increasing tenderness, increase in size, and or pustulant drainage from site. If this is to occur please let me know immediately.    Regards,   Derward Marple FNP-C   

## 2022-05-27 NOTE — Progress Notes (Signed)
Established Patient Office Visit  Subjective:   Patient ID: Kathleen Thomas, female    DOB: 30-Dec-1945  Age: 77 y.o. MRN: [id number]  CC:  Chief Complaint  Patient presents with   Cyst    Upper back area    HPI: Kathleen Thomas is a 77 y.o. female presenting on 05/27/2022 for Cyst (Upper back area)  About one week ago started with a cyst on her back, since has increased in size and is tender. She is applying warm compresses on her back    HPI     ROS: Negative unless specifically indicated above in HPI.   Relevant past medical history reviewed and updated as indicated.   Allergies and medications reviewed and updated.   Current Outpatient Medications:    amLODipine (NORVASC) 5 MG tablet, Take 5 mg by mouth daily., Disp: , Rfl:    Cholecalciferol (VITAMIN D3) 25 MCG (1000 UT) CAPS, Take 1,000 Units by mouth daily., Disp: , Rfl:    doxycycline (VIBRA-TABS) 100 MG tablet, Take 1 tablet (100 mg total) by mouth 2 (two) times daily for 10 days., Disp: 20 tablet, Rfl: 0   irbesartan (AVAPRO) 75 MG tablet, Take 75 mg by mouth., Disp: , Rfl:    sertraline (ZOLOFT) 50 MG tablet, Take 1 tablet (50 mg total) by mouth daily., Disp: 90 tablet, Rfl: 3  Allergies  Allergen Reactions   Actonel [Risedronate Sodium] Other (See Comments)    Flu like symptoms   Fosamax [Alendronate Sodium] Other (See Comments)    Abdominal pain    Objective:   BP 118/64 (BP Location: Left Arm)   Pulse 84   Temp 98.7 F (37.1 C) (Temporal)   Ht  (1.676 m)   Wt 125 lb 9.6 oz (57 kg)   SpO2 98%   BMI 20.27 kg/m    Physical Exam Constitutional:      General: She is not in acute distress.    Appearance: Normal appearance. She is normal weight. She is not ill-appearing, toxic-appearing or diaphoretic.  HENT:     Head: Normocephalic.  Cardiovascular:     Rate and Rhythm: Normal rate.  Pulmonary:     Effort: Pulmonary effort is normal.  Musculoskeletal:        General: Normal range of  motion.  Skin:    Findings: Abscess (left upper back with tenderness and erythema no head at the moment. not boggy. approximately 5 cm in diameter) present.  Neurological:     General: No focal deficit present.     Mental Status: She is alert and oriented to person, place, and time. Mental status is at baseline.  Psychiatric:        Mood and Affect: Mood normal.        Behavior: Behavior normal.        Thought Content: Thought content normal.        Judgment: Judgment normal.       Assessment & Plan:  Abscess of back Assessment & Plan: RX doxycycline 100 mg po bid x 10 days Pt advised to:  Please monitor site for worsening signs/symptoms of infection to include: increasing redness, increasing tenderness, increase in size, and or pustulant drainage from site. If this is to occur please let me know immediately.   Advised to f/u one week will assess need for possible I&D  Orders: -     Doxycycline Hyclate; Take 1 tablet (100 mg total) by mouth 2 (two) times daily for 10 days.  Dispense: 20 tablet; Refill: 0     Follow up plan: Return in about 1 week (around 06/03/2022) for f/u abscess .  Mort Sawyers, FNP

## 2022-06-04 ENCOUNTER — Ambulatory Visit: Payer: No Typology Code available for payment source | Admitting: Family

## 2022-06-07 ENCOUNTER — Encounter: Payer: Self-pay | Admitting: Family

## 2022-06-07 ENCOUNTER — Ambulatory Visit: Payer: No Typology Code available for payment source | Admitting: Family

## 2022-06-07 VITALS — BP 122/66 | HR 76 | Temp 98.2°F | Ht 66.0 in | Wt 122.2 lb

## 2022-06-07 DIAGNOSIS — L02212 Cutaneous abscess of back [any part, except buttock]: Secondary | ICD-10-CM

## 2022-06-07 DIAGNOSIS — L0291 Cutaneous abscess, unspecified: Secondary | ICD-10-CM

## 2022-06-07 MED ORDER — MUPIROCIN 2 % EX OINT: 1.0000 | TOPICAL_OINTMENT | Freq: Two times a day (BID) | CUTANEOUS | 0 refills | Status: DC

## 2022-06-07 MED ORDER — DOXYCYCLINE HYCLATE 100 MG PO TABS: 100.0000 mg | ORAL_TABLET | Freq: Two times a day (BID) | ORAL | 0 refills | Status: AC

## 2022-06-07 NOTE — Progress Notes (Signed)
Established Patient Office Visit  Subjective:   Patient ID: Kathleen Thomas, female    DOB: [birth year]  Age: 77 y.o. MRN: [id number]  CC:  Chief Complaint  Patient presents with   Follow-up    Cyst on back    HPI: Kathleen Thomas is a 77 y.o. female presenting on 06/07/2022 for Follow-up (Cyst on back)   HPI  Completed ten days of antibiotic on her left upper back abscess of doxycycline with some decrease in size of abscess however still red and tender. States only mild bloody discharge seen at home.   Denies fever or chills.  Warmth to site and tenderness         ROS: Negative unless specifically indicated above in HPI.   Relevant past medical history reviewed and updated as indicated.   Allergies and medications reviewed and updated.   Current Outpatient Medications:    amLODipine (NORVASC) 5 MG tablet, Take 5 mg by mouth daily., Disp: , Rfl:    Cholecalciferol (VITAMIN D3) 25 MCG (1000 UT) CAPS, Take 1,000 Units by mouth daily., Disp: , Rfl:    doxycycline (VIBRA-TABS) 100 MG tablet, Take 1 tablet (100 mg total) by mouth 2 (two) times daily for 7 days., Disp: 14 tablet, Rfl: 0   irbesartan (AVAPRO) 75 MG tablet, Take 75 mg by mouth., Disp: , Rfl:    mupirocin ointment (BACTROBAN) 2 %, Apply 1 Application topically 2 (two) times daily., Disp: 22 g, Rfl: 0   sertraline (ZOLOFT) 50 MG tablet, Take 1 tablet (50 mg total) by mouth daily., Disp: 90 tablet, Rfl: 3  Allergies  Allergen Reactions   Actonel [Risedronate Sodium] Other (See Comments)    Flu like symptoms   Fosamax [Alendronate Sodium] Other (See Comments)    Abdominal pain    Objective:   BP 122/66 (BP Location: Left Arm)   Pulse 76   Temp 98.2 F (36.8 C) (Temporal)   Ht 5\' 6"  (1.676 m)   Wt 122 lb 3.2 oz (55.4 kg)   SpO2 98%   BMI 19.72 kg/m    Physical Exam Skin:    Comments: 4.5 cm annular abscess with erythema, warmth, tenderness on palpation         Patient does not have previous  history of MRSA.  Patient does not have diabetes.   The patient gave informed consent for the procedure. Patient and I discussed risks, benefits, and alternatives to I & D.  Including risk of infection from laceration, localized pain, and bleeding. We discussed the option for oral antibiotics. Patient verbalized understanding to conversation and all questions were answered. We jointly decided to proceed with procedure.  On exam, there is a 4.5 cm indurated abscess.  It is tender to palpation.  There is erythema with no discharge.  The patient gave informed consent for the procedure. The area was prepped with antiseptic solution. The area was anesthetized using lidocaine. The patient tolerated the anesthetic well.    #11 blade was used to incise the abscess.  Purulent bloody drainage was noted.  Bleeding from the wound was controlled by applying direct pressure with gauze.   Iodoform was used to pack the abscess.  A bandage was placed.  Wound culture was obtained.  The patient tolerated the procedure well.  Extensive instructions were given to the patient.   Complications: None   Assessment & Plan:  Abscess -     Ambulatory referral to General Surgery -     WOUND CULTURE -  Doxycycline Hyclate; Take 1 tablet (100 mg total) by mouth 2 (two) times daily for 7 days.  Dispense: 14 tablet; Refill: 0 -     Mupirocin; Apply 1 Application topically 2 (two) times daily.  Dispense: 22 g; Refill: 0  Abscess of back Assessment & Plan: Advised to Take antibiotic as directed and to  Follow-up for wound check in 2-3 days. Rx doxycycline 100 mg po bid x 10 days  Also referral placed for general surgeon as minimal purulent drainage expressed.       Follow up plan: Return in about 2 days (around 06/09/2022) for f/u wound.  Mort Sawyers, FNP

## 2022-06-07 NOTE — Assessment & Plan Note (Signed)
Advised to Take antibiotic as directed and to  Follow-up for wound check in 2-3 days. Rx doxycycline 100 mg po bid x 10 days  Also referral placed for general surgeon as minimal purulent drainage expressed.

## 2022-06-07 NOTE — Patient Instructions (Addendum)
  Please monitor site for worsening signs/symptoms of infection to include: increasing redness, increasing tenderness, increase in size, and or pustulant drainage from site. If this is to occur please let me know immediately.   Warm soaks as able throughout the day.    Regards,   Mort Sawyers FNP-C

## 2022-06-08 ENCOUNTER — Ambulatory Visit: Payer: No Typology Code available for payment source | Admitting: Family

## 2022-06-08 ENCOUNTER — Telehealth: Payer: Self-pay

## 2022-06-08 ENCOUNTER — Ambulatory Visit (INDEPENDENT_AMBULATORY_CARE_PROVIDER_SITE_OTHER): Payer: No Typology Code available for payment source | Admitting: Surgery

## 2022-06-08 ENCOUNTER — Encounter: Payer: Self-pay | Admitting: Surgery

## 2022-06-08 VITALS — BP 129/75 | HR 79 | Temp 98.1°F | Ht 66.0 in | Wt 123.4 lb

## 2022-06-08 DIAGNOSIS — L03312 Cellulitis of back [any part except buttock]: Secondary | ICD-10-CM | POA: Diagnosis not present

## 2022-06-08 NOTE — Progress Notes (Signed)
Patient ID: Kathleen Thomas, female   DOB: Jun 11, 1945, 77 y.o.   MRN: [id number]  Chief Complaint: Abscess/cellulitis left thoracic back for 1 month  History of Present Illness Kathleen Thomas is a 77 y.o. female with underwent a 10-day course of doxycycline with residual hyperemic area, incision and drainage was attempted yesterday only having blood without any evidence of pus or residual cystic debris, or exudate.  She presents today having no fevers or chills, and denying any pain.  She felt that her pain had resolved following her course of antibiotics.  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   EGD - not ablated, not bleeing was incidental   Cherry angioma    Depression    recurrent   Dermatitis    Hallux valgus (acquired), left foot    with bunions   Hammer toes of both feet    Helicobacter pylori infection    Hyperlipidemia    Hypertension    Leukopenia    mild   Mitral regurgitation    mild   Osteopenia    Osteoporosis    Pure hypercholesterolemia    Scoliosis    Seborrheic keratoses    Vitamin D deficiency       Past Surgical History:  Procedure Laterality Date   BUNIONECTOMY Left    childbirth     x2   COLONOSCOPY     TONSILLECTOMY     TUBAL LIGATION      Allergies  Allergen Reactions   Actonel [Risedronate Sodium] Other (See Comments)    Flu like symptoms   Fosamax [Alendronate Sodium] Other (See Comments)    Abdominal pain    Current Outpatient Medications  Medication Sig Dispense Refill   amLODipine (NORVASC) 5 MG tablet Take 5 mg by mouth daily.     Cholecalciferol (VITAMIN D3) 25 MCG (1000 UT) CAPS Take 1,000 Units by mouth daily.     doxycycline (VIBRA-TABS) 100 MG tablet Take 1 tablet (100 mg total) by mouth 2 (two) times daily for 7 days. 14 tablet 0   irbesartan (AVAPRO) 75 MG tablet Take 75 mg by mouth.      mupirocin ointment (BACTROBAN) 2 % Apply 1 Application topically 2 (two) times daily. 22 g 0   sertraline (ZOLOFT) 50 MG tablet Take 1 tablet (50 mg total) by mouth daily. 90 tablet 3   No current facility-administered medications for this visit.    Family History Family History  Problem Relation Age of Onset   Atrial fibrillation Mother    Congestive Heart Failure Mother    Congestive Heart Failure Father    Stomach cancer Paternal Grandfather    Epilepsy Son        mild, has hx of seizure   Alcohol abuse Son    Colon polyps Neg Hx    Colon cancer Neg Hx    Esophageal cancer Neg Hx    Rectal cancer Neg Hx       Social History Social History   Tobacco Use   Smoking status: Never   Smokeless tobacco: Never  Vaping Use   Vaping Use: Never used  Substance Use Topics   Alcohol use: Never   Drug use: Never        Review of Systems  All other systems reviewed and are negative.    Physical Exam Blood pressure  129/75, pulse 79, temperature 98.1 F (36.7 C), temperature source Oral, height 5\' 6"  (1.676 m), weight 123 lb 6.4 oz (56 kg), SpO2 96 %. Last Weight  Most recent update: 06/08/2022  2:35 PM    Weight  56 kg (123 lb 6.4 oz)             CONSTITUTIONAL: Well developed, and nourished, appropriately responsive and aware without distress.   EYES: Sclera non-icteric.   EARS, NOSE, MOUTH AND THROAT:  The oropharynx is clear. Oral mucosa is pink and moist.   Hearing is intact to voice.  NECK: Trachea is midline, and there is no jugular venous distension.  LYMPH NODES:  Lymph nodes in the neck are not appreciated. RESPIRATORY:  Normal respiratory effort without pathologic use of accessory muscles. CARDIOVASCULAR:  Well perfused.  GI: The abdomen is  soft, nontender, and nondistended. MUSCULOSKELETAL:  Symmetrical muscle tone appreciated in all four extremities.    SKIN: Skin turgor is normal. No pathologic skin lesions appreciated.   Remove the dressing she  presented with.  The small amount of packing was not within the incision.  I prepped the area about the incision and the wound.  I used a sterile Q-tip to explore it.  It was tender, and the incision appeared sufficiently deep to drain anything that would have caused the adjacent residual hyperemia.  Yet due to the patient's body habitus it was superficial enough, not to hold a packing strip, or require additional packing.  So I agree with the plan to apply topical antibiotic, and replace dressing on a daily basis.  She will complete another 7-day course of doxycycline.  Will reevaluate her in 2 weeks per her schedule.  NEUROLOGIC:  Motor and sensation appear grossly normal.  Cranial nerves are grossly without defect. PSYCH:  Alert and oriented to person, place and time. Affect is appropriate for situation.  Data Reviewed I have personally reviewed what is currently available of the patient's imaging, recent labs and medical records.   Labs:     Latest Ref Rng & Units 09/16/2021    9:49 AM  CBC  WBC 3.8 - 10.8 Thousand/uL 4.7   Hemoglobin 11.7 - 15.5 g/dL 16.1   Hematocrit 09.6 - 45.0 % 38.9   Platelets 140 - 400 Thousand/uL 210       Latest Ref Rng & Units 03/15/2022    8:28 AM 09/16/2021    9:49 AM 02/25/2021    3:19 PM  CMP  Glucose 65 - 99 mg/dL  96  045   BUN 7 - 25 mg/dL  9  13   Creatinine 4.09 - 1.00 mg/dL 8.11  9.14  7.82   Sodium 135 - 146 mmol/L  135  138   Potassium 3.5 - 5.3 mmol/L  4.4  4.2   Chloride 98 - 110 mmol/L  100  102   CO2 20 - 32 mmol/L  24  26   Calcium 8.6 - 10.4 mg/dL  9.6  9.5   Total Protein 6.1 - 8.1 g/dL  7.1    Total Bilirubin 0.2 - 1.2 mg/dL  0.6    AST 10 - 35 U/L  17    ALT 6 - 29 U/L  11        Imaging: Radiological images reviewed:   Within last 24 hrs: No results found.  Assessment    Cellulitis, with well localized phlegmon like reaction, without sufficient abscess rewarding its drainage. Patient Active Problem List   Diagnosis Date  Noted   Abscess of back 05/27/2022   GAD (generalized anxiety disorder) 01/19/2022   Primary hypertension 09/16/2021   Neuralgia 09/16/2021   Polyarthralgia 09/16/2021   Postmenopausal 09/16/2021   Vitamin D deficiency 09/16/2021   Abnormal weight loss 09/16/2021   Personal history of colonic polyps 02/27/2020   Hyperlipidemia 02/27/2020   Insomnia 02/27/2020   Stress due to illness of family member 02/27/2020   Bilateral chronic knee pain 05/25/2019   Conductive hearing loss 04/08/2017   PVCs (premature ventricular contractions) 01/22/2011   Mitral regurgitation    Aortic insufficiency     Plan    As discussed above under the skin exam.  Will have her follow-up in 2 weeks, she will complete her doxycycline and continue the ointment topically.  She will continue to apply a dressing or Band-Aid to the area.  Face-to-face time spent with the patient and accompanying care providers(if present) was 25 minutes, with more than 50% of the time spent counseling, educating, and coordinating care of the patient.    These notes generated with voice recognition software. I apologize for typographical errors.  Campbell Lerner M.D., FACS 06/08/2022, 2:56 PM

## 2022-06-08 NOTE — Patient Instructions (Signed)
Continue the Doxycycline and keep a dry dressing over abscess.   If you have any concerns or questions, please feel free to call our office. See follow up appointment below.   Skin Abscess  A skin abscess is an infected spot of skin. It can have pus in it. An abscess can happen in any part of your body. Some abscesses break open (rupture) on their own. Most keep getting worse unless they are treated. If your abscess is not treated, the infection can spread deeper into your body and blood. This can make you feel sick. What are the causes? Germs that enter your skin. This may happen if you have: A cut or scrape. A wound from a needle or an insect bite. Blocked oil or sweat glands. A problem with the spot where your hair goes into your skin. A fluid-filled sac called a cyst under your skin. What increases the risk? Having problems with how your blood moves through your body. Having a weak body defense system (immune system). Having diabetes. Having dry and irritated skin. Needing to get shots often. Putting drugs into your body with a needle. Having a splinter or something else in your skin. Smoking. What are the signs or symptoms? A firm bump under your skin that hurts. A bump with pus at the top. Redness and swelling. Warm or tender spots. A sore on the skin. How is this treated? You may need to: Put a heat pack or a warm, wet washcloth on the spot. Have the pus drained. Take antibiotics. Follow these instructions at home: Medicines Take over-the-counter and prescription medicines only as told by your doctor. If you were prescribed antibiotics, take them as told by your doctor. Do not stop taking them even if you start to feel better. Abscess care  If you have an abscess that has not drained, put heat on it. Use the heat source that your doctor recommends, such as a moist heat pack or a heating pad. Place a towel between your skin and the heat source. Leave the heat on for  20-30 minutes. If your skin turns bright red, take off the heat right away to prevent burns. The risk of burns is higher if you cannot feel pain, heat, or cold. Follow instructions from your doctor about how to take care of your abscess. Make sure you: Cover the abscess with a bandage. Wash your hands with soap and water for at least 20 seconds before and after you change your bandage. If you cannot use soap and water, use hand sanitizer. Change your bandage as told by your doctor. Check your abscess every day for signs that the infection is getting worse. Check for: More redness, swelling, or pain. More fluid or blood. Warmth. More pus or a worse smell. General instructions To keep the infection from spreading: Do not share personal items or towels. Do not go in a hot tub with others. Avoid making skin contact with others. Be careful when you get rid of used bandages or any pus from the abscess. Do not smoke or use any products that contain nicotine or tobacco. If you need help quitting, ask your doctor. Contact a doctor if: You see red streaks on your skin near the abscess. You have any signs of worse infection. You vomit every time you eat or drink. You have a fever, chills, or muscle aches. The cyst or abscess comes back. Get help right away if: You have very bad pain. You make less pee (urine) than  normal. This information is not intended to replace advice given to you by your health care provider. Make sure you discuss any questions you have with your health care provider. Document Revised: 09/02/2021 Document Reviewed: 09/02/2021 Elsevier Patient Education  2023 [employer].

## 2022-06-08 NOTE — Telephone Encounter (Signed)
Left message to return call to our office. Pt has been scheduled for general surgery today and no longer will need to f/u with Tabitha today. I will try to call back to advise that we can cancel her appt with Tabitha today and for her to go ahead to her appt with the specialist.

## 2022-06-08 NOTE — Progress Notes (Signed)
Culture unremarkable.

## 2022-06-09 ENCOUNTER — Ambulatory Visit (HOSPITAL_COMMUNITY): Payer: No Typology Code available for payment source | Attending: Cardiology

## 2022-06-09 DIAGNOSIS — I351 Nonrheumatic aortic (valve) insufficiency: Secondary | ICD-10-CM | POA: Insufficient documentation

## 2022-06-09 LAB — WOUND CULTURE
MICRO NUMBER:: 14917609
SPECIMEN QUALITY:: ADEQUATE

## 2022-06-09 LAB — ECHOCARDIOGRAM COMPLETE
Area-P 1/2: 3.7 cm2
MV M vel: 5.06 m/s
MV Peak grad: 102.4 mmHg
P 1/2 time: 867 msec
Radius: 0.5 cm
S' Lateral: 2.8 cm

## 2022-06-10 ENCOUNTER — Telehealth: Payer: Self-pay | Admitting: *Deleted

## 2022-06-10 ENCOUNTER — Telehealth: Payer: Self-pay | Admitting: Cardiology

## 2022-06-10 DIAGNOSIS — I77819 Aortic ectasia, unspecified site: Secondary | ICD-10-CM

## 2022-06-10 DIAGNOSIS — I340 Nonrheumatic mitral (valve) insufficiency: Secondary | ICD-10-CM

## 2022-06-10 DIAGNOSIS — I7121 Aneurysm of the ascending aorta, without rupture: Secondary | ICD-10-CM

## 2022-06-10 DIAGNOSIS — I351 Nonrheumatic aortic (valve) insufficiency: Secondary | ICD-10-CM

## 2022-06-10 NOTE — Progress Notes (Signed)
Good afternoon Dr. Claudine Mouton, thank you so much for evaluating this pt. I received back her wound culture, positive for strep. She is currently taking doxycycline, would you like for me to change it or keep as is?

## 2022-06-10 NOTE — Telephone Encounter (Signed)
-----   Message from Heather E Pemberton, MD sent at 06/09/2022  7:57 PM EDT ----- Her echo shows normal pumping function. There is mild leakiness of the mitral and aortic valve which is stable. Her aorta is mildly dilated which we will continue to monitor with yearly echoes. 

## 2022-06-10 NOTE — Telephone Encounter (Signed)
I think this was already done.  I sent in one year supply for the 50 mg 4/18

## 2022-06-10 NOTE — Telephone Encounter (Signed)
-----   Message from Meriam Sprague, MD sent at 06/09/2022  7:57 PM EDT ----- Her echo shows normal pumping function. There is mild leakiness of the mitral and aortic valve which is stable. Her aorta is mildly dilated which we will continue to monitor with yearly echoes.

## 2022-06-10 NOTE — Telephone Encounter (Signed)
Patient is returning call for echo results. °

## 2022-06-10 NOTE — Telephone Encounter (Signed)
The patient has been notified of the result and verbalized understanding.  All questions (if any) were answered.  Pt aware that we will go ahead and order for her to get a repeat echo in one year for surveillance.   She is aware that I will send a message to our echo scheduler to give her a call back to get this appt scheduled.   Pt verbalized understanding and agrees with this plan.

## 2022-06-10 NOTE — Telephone Encounter (Signed)
Left the pt a message to call the office back to endorse echo results and recommendations per Dr. Shari Prows.   Will go ahead and place the repeat echo in one year in the system, and endorse this to the pt when she returns a call back.  Will also have our ECHO Scheduler reach out to her to make the one year echo appt.

## 2022-06-14 ENCOUNTER — Encounter (HOSPITAL_COMMUNITY): Payer: Self-pay

## 2022-06-22 ENCOUNTER — Encounter: Payer: Self-pay | Admitting: Surgery

## 2022-06-22 ENCOUNTER — Ambulatory Visit (INDEPENDENT_AMBULATORY_CARE_PROVIDER_SITE_OTHER): Payer: No Typology Code available for payment source | Admitting: Surgery

## 2022-06-22 VITALS — BP 125/76 | HR 76 | Temp 98.0°F | Ht 66.0 in | Wt 121.8 lb

## 2022-06-22 DIAGNOSIS — L03312 Cellulitis of back [any part except buttock]: Secondary | ICD-10-CM

## 2022-06-22 NOTE — Patient Instructions (Signed)
The area should continue to soften and settle down. Be sure to wash well with soap and water when you shower.  You may also use heat to the area if soothing, 15-20 minutes at a time.   Follow up here in 2 weeks.

## 2022-06-22 NOTE — Progress Notes (Signed)
[facility] POST-OP OFFICE VISIT  06/22/2022  HPI: Kathleen Thomas is a 77 y.o. female s/p I&D of a superficial abscess of her left upper back.  It is closed, there is no further drainage.  But she still senses some degree of tenderness there potential inadequate healing, somewhat uncomfortable with hot water from the shower running on it.  She finished her doxycycline.  Vital signs: Ht 5\' 6"  (1.676 m)   Wt 121 lb 12.8 oz (55.2 kg)   BMI 19.66 kg/m    Physical Exam: Constitutional: She appears well  Skin: The site looks clean, there is minimal residual induration there.  Mostly this is consistent with the healing process.  There is no erythema, and no nodular presents to suggest a persisting lesion.  Assessment/Plan: This is a 77 y.o. female s/p thoracic back cutaneous abscess, seems to be healing.  Patient Active Problem List   Diagnosis Date Noted   Abscess of back 05/27/2022   GAD (generalized anxiety disorder) 01/19/2022   Primary hypertension 09/16/2021   Neuralgia 09/16/2021   Polyarthralgia 09/16/2021   Postmenopausal 09/16/2021   Vitamin D deficiency 09/16/2021   Abnormal weight loss 09/16/2021   Personal history of colonic polyps 02/27/2020   Hyperlipidemia 02/27/2020   Insomnia 02/27/2020   Stress due to illness of family member 02/27/2020   Bilateral chronic knee pain 05/25/2019   Conductive hearing loss 04/08/2017   PVCs (premature ventricular contractions) 01/22/2011   Mitral regurgitation    Aortic insufficiency     -We discussed that I do doubt topical antibiotics will be helpful, I think oral antibiotics have sufficiently provided the improvement needed.  Application of heat or massage would not be harmful in any way.  We discussed caution to ensure no thermal injury.  I will be glad to recheck her in a couple weeks to make sure she is healing as expected.   Campbell Lerner M.D., FACS 06/22/2022, 9:38 AM

## 2022-07-08 ENCOUNTER — Ambulatory Visit: Payer: No Typology Code available for payment source | Admitting: Surgery

## 2022-08-09 ENCOUNTER — Telehealth: Payer: Self-pay | Admitting: Family

## 2022-08-09 DIAGNOSIS — F411 Generalized anxiety disorder: Secondary | ICD-10-CM

## 2022-08-09 NOTE — Telephone Encounter (Signed)
Prescription Request *Patient stated when she called in that she was told if she felt like she needed to increase dosage to let Tabitha know, states she would like to go up on the dosage* 08/09/2022  LOV: 06/07/2022  What is the name of the medication or equipment? sertraline (ZOLOFT) 50 MG tablet   Have you contacted your pharmacy to request a refill? No   Which pharmacy would you like this sent to?  [id number], [place] - [address] [place] [address] Phone: [phone] Fax: [phone]    Patient notified that their request is being sent to the clinical staff for review and that they should receive a response within 2 business days.   Please advise at Mobile [phone] (mobile)

## 2022-08-09 NOTE — Telephone Encounter (Signed)
*  Patient stated when she called in that she was told if she felt like she needed to increase dosage to let Tabitha know, states she would like to go up on the dosage*

## 2022-08-10 NOTE — Telephone Encounter (Signed)
Patient returned call and stated that she is currently taking 50 mg  daily,but would like to be increased to 100mg ?

## 2022-08-10 NOTE — Telephone Encounter (Signed)
Is she currently taking 1/2 of 50 mg once daily or 50 mg once daily? She was on 25 initially.   I can certainly increase, but need to confirm current dose first.

## 2022-08-12 MED ORDER — SERTRALINE HCL 100 MG PO TABS
100.0000 mg | ORAL_TABLET | Freq: Every day | ORAL | 1 refills | Status: DC
Start: 2022-08-12 — End: 2022-09-22

## 2022-08-12 NOTE — Addendum Note (Signed)
Addended by: Mort Sawyers on: 08/12/2022 03:00 PM   Modules accepted: Orders

## 2022-08-12 NOTE — Telephone Encounter (Signed)
Left message to return call to our office.  

## 2022-08-12 NOTE — Progress Notes (Signed)
Cardiology Office Note:    Date:  08/23/2022   ID:  Kathleen Thomas, DOB 08/30/1945, MRN [id number]  PCP:  Kathleen Sawyers, FNP   [facility] Providers Cardiologist:  None {    Referring MD: Kathleen Sawyers, FNP     History of Present Illness:    Kathleen Thomas is a 77 y.o. female with a hx of anxiety, depression, palpitations, mild AI, mild AR, and palpitations who was previously followed by Dr. Delton Thomas who now returns to clinic for follow-up.  Per review of the record, TTE 2021 with LVEF 60-65%, with mild AI, mild MR, ascending aorta 40mm, aortic root 37mm  Last saw Dr. Delton Thomas on 06/12/19 where she was doing well with no anginal symptoms. Had CT calcium score which was 6. Aorta measures 39mm.  Last seen in clinic on 08/2021 where she was doing well. Was struggling with numbness in her wrist deemed to be neuropathic in nature.  TTE with EF 55-60%, normal RV, mild MR, mild AR, ascending aorta  Today, the patient overall feels well. No chest pain, SOB, orthopnea, PND, or LE edema. Remains active without exertional symptoms. Tolerating medications as prescribed.   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   EGD - not ablated, not bleeing was incidental   Cherry angioma    Depression    recurrent   Dermatitis    Hallux valgus (acquired), left foot    with bunions   Hammer toes of both feet    Helicobacter pylori infection    Hyperlipidemia    Hypertension    Leukopenia    mild   Mitral regurgitation    mild   Osteopenia    Osteoporosis    Pure hypercholesterolemia    Scoliosis    Seborrheic keratoses    Vitamin D deficiency     Past Surgical History:  Procedure Laterality Date   BUNIONECTOMY Left    childbirth     x2   COLONOSCOPY     TONSILLECTOMY     TUBAL LIGATION      Current Medications: Current Meds  Medication Sig    amLODipine (NORVASC) 5 MG tablet Take 5 mg by mouth daily.   Cholecalciferol (VITAMIN D3) 25 MCG (1000 UT) CAPS Take 1,000 Units by mouth daily.   irbesartan (AVAPRO) 75 MG tablet Take 75 mg by mouth.   sertraline (ZOLOFT) 100 MG tablet Take 1 tablet (100 mg total) by mouth daily.     Allergies:   Actonel [risedronate sodium] and Fosamax [alendronate sodium]   Social History   Socioeconomic History   Marital status: Married    Spouse name: Not on file   Number of children: 2   Years of education: high school   Highest education level: Not on file  Occupational History   Not on file  Tobacco Use   Smoking status: Never    Passive exposure: Never   Smokeless tobacco: Never  Vaping Use   Vaping status: Never Used  Substance and Sexual Activity   Alcohol use: Never   Drug use: Never   Sexual activity: Not Currently    Partners: Male    Birth control/protection: Abstinence    Comment: husband in memory care  Other Topics Concern   Not on file  Social History Narrative   02/27/20   From: the area   Son  with alcohol abuse. Daughter also at home.    Work: retired - Public librarian       Family: 2 children - Daughter Kathleen Thomas and son Kathleen Thomas -- 3 grandchildren, Husband Kathleen Leriche) - lives in a memory care unit - she still sees him      Enjoys: working outside, Programme researcher, broadcasting/film/video, sewing, cooking      Exercise: walking and yardwork   Diet: limits fried foods, processed foods, tries to eat veggies/fruits, limits red meat      Safety   Seat belts: Yes    Guns: Yes  and not currently secure      Social Determinants of Corporate investment banker Strain: Not on file  Food Insecurity: Not on file  Transportation Needs: Not on file  Physical Activity: Not on file  Stress: Not on file  Social Connections: Not on file     Family History: The patient's family history includes Alcohol abuse in her son; Atrial fibrillation in her mother; Congestive Heart Failure in her father and  mother; Epilepsy in her son; Stomach cancer in her paternal grandfather. There is no history of Colon polyps, Colon cancer, Esophageal cancer, or Rectal cancer.  ROS:   Please Thomas the history of present illness.  EKGs/Labs/Other Studies Reviewed:    The following studies were reviewed today: Cardiac Studies & Procedures       ECHOCARDIOGRAM  ECHOCARDIOGRAM COMPLETE 06/09/2022  Narrative ECHOCARDIOGRAM REPORT    Patient Name:   Kathleen Thomas Date of Exam: 06/09/2022 Medical Rec #:  [id number]      Height:       66.0 in Accession #:    [id number]     Weight:       123.4 lb Date of Birth:  05-28-1945      BSA:          1.629 m Patient Age:    76 years       BP:           136/87 mmHg Patient Gender: F              HR:           69 bpm. Exam Location:  [address]  Procedure: 2D Echo, Cardiac Doppler, Color Doppler and Strain Analysis  Indications:    I35.1 Aortic regurgitation  History:        Patient has prior history of Echocardiogram examinations, most recent 06/28/2019. Risk Factors:Hypertension and HLD.  Sonographer:    Clearence Ped RCS Referring Phys: [id number] Dorathea Faerber E Aldora Perman  IMPRESSIONS   1. Left ventricular ejection fraction, by estimation, is 55 to 60%. The left ventricle has normal function. The left ventricle has no regional wall motion abnormalities. There is mild left ventricular hypertrophy. Left ventricular diastolic parameters were normal. 2. Right ventricular systolic function is normal. The right ventricular size is normal. There is normal pulmonary artery systolic pressure. The estimated right ventricular systolic pressure is 18.2 mmHg. 3. The mitral valve is normal in structure. Mild mitral valve regurgitation. 4. The aortic valve was not well visualized. Aortic valve regurgitation is mild. No aortic stenosis is present. 5. Aortic dilatation noted. There is dilatation of the ascending aorta, measuring 40 mm. 6. The inferior vena cava is normal in size  with greater than 50% respiratory variability, suggesting right atrial pressure of 3 mmHg.  FINDINGS Left Ventricle: Left ventricular ejection fraction, by estimation, is 55 to 60%. The left ventricle has normal function. The left ventricle has no  regional wall motion abnormalities. The left ventricular internal cavity size was normal in size. There is mild left ventricular hypertrophy. Left ventricular diastolic parameters were normal.  Right Ventricle: The right ventricular size is normal. No increase in right ventricular wall thickness. Right ventricular systolic function is normal. There is normal pulmonary artery systolic pressure. The tricuspid regurgitant velocity is 1.95 m/s, and with an assumed right atrial pressure of 3 mmHg, the estimated right ventricular systolic pressure is 18.2 mmHg.  Left Atrium: Left atrial size was normal in size.  Right Atrium: Right atrial size was normal in size.  Pericardium: There is no evidence of pericardial effusion.  Mitral Valve: The mitral valve is normal in structure. Mild mitral valve regurgitation.  Tricuspid Valve: The tricuspid valve is normal in structure. Tricuspid valve regurgitation is trivial.  Aortic Valve: The aortic valve was not well visualized. Aortic valve regurgitation is mild. Aortic regurgitation PHT measures 867 msec. No aortic stenosis is present.  Pulmonic Valve: The pulmonic valve was not well visualized. Pulmonic valve regurgitation is trivial.  Aorta: The aortic root is normal in size and structure and aortic dilatation noted. There is dilatation of the ascending aorta, measuring 40 mm.  Venous: The inferior vena cava is normal in size with greater than 50% respiratory variability, suggesting right atrial pressure of 3 mmHg.  IAS/Shunts: The interatrial septum was not well visualized.   LEFT VENTRICLE PLAX 2D LVIDd:         4.10 cm   Diastology LVIDs:         2.80 cm   LV e' medial:    9.36 cm/s LV PW:          0.90 cm   LV E/e' medial:  7.2 LV IVS:        1.00 cm   LV e' lateral:   14.90 cm/s LVOT diam:     1.90 cm   LV E/e' lateral: 4.5 LV SV:         63 LV SV Index:   39        2D Longitudinal Strain LVOT Area:     2.84 cm  2D Strain GLS (A2C):   -20.7 % 2D Strain GLS (A3C):   -15.4 % 2D Strain GLS (A4C):   -15.1 % 2D Strain GLS Avg:     -17.1 %  RIGHT VENTRICLE RV Basal diam:  2.80 cm RV S prime:     11.30 cm/s TAPSE (M-mode): 1.6 cm RVSP:           18.2 mmHg  LEFT ATRIUM             Index        RIGHT ATRIUM           Index LA diam:        3.70 cm 2.27 cm/m   RA Pressure: 3.00 mmHg LA Vol (A2C):   63.2 ml 38.80 ml/m  RA Area:     10.70 cm LA Vol (A4C):   39.1 ml 24.01 ml/m  RA Volume:   22.30 ml  13.69 ml/m LA Biplane Vol: 52.6 ml 32.29 ml/m AORTIC VALVE LVOT Vmax:   101.00 cm/s LVOT Vmean:  74.400 cm/s LVOT VTI:    0.222 m AI PHT:      867 msec  AORTA Ao Root diam: 3.20 cm Ao Asc diam:  4.00 cm  MITRAL VALVE                  TRICUSPID VALVE  MV Area (PHT):                TR Peak grad:   15.2 mmHg MV Decel Time:                TR Vmax:        195.00 cm/s MR Peak grad:    102.4 mmHg   Estimated RAP:  3.00 mmHg MR Mean grad:    65.0 mmHg    RVSP:           18.2 mmHg MR Vmax:         506.00 cm/s MR Vmean:        382.0 cm/s   SHUNTS MR PISA:         1.57 cm     Systemic VTI:  0.22 m MR PISA Eff ROA: 10 mm       Systemic Diam: 1.90 cm MR PISA Radius:  0.50 cm MV E velocity: 67.60 cm/s MV A velocity: 68.10 cm/s MV E/A ratio:  0.99  Epifanio Lesches MD Electronically signed by Epifanio Lesches MD Signature Date/Time: 06/09/2022/11:29:35 AM    Final     CT SCANS  CT CARDIAC SCORING (SELF PAY ONLY) 03/27/2020  Addendum 03/27/2020  1:36 PM ADDENDUM REPORT: 03/27/2020 13:34  CLINICAL DATA:  Risk stratification  EXAM: Coronary Calcium Score  TECHNIQUE: The patient was scanned on a [employer] scanner. Axial non-contrast 3 mm slices were carried out  through the heart. The data set was analyzed on a dedicated work station and scored using the Agatson method.  FINDINGS: Non-cardiac: Thomas separate report from [facility].  Ascending Aorta: Mildly dilated at 3.9 cm (non-contrast) - aortic atherosclerosis  Pericardium: Normal  Coronary arteries: Normal origins.  IMPRESSION: Coronary calcium score of 6. This was 39th percentile for age and sex matched control.  Dilated ascending aorta to 3.9 cm.  Aortic atherosclerosis.   Electronically Signed By: Chrystie Nose M.D. On: 03/27/2020 13:34  Narrative EXAM: OVER-READ INTERPRETATION  CT CHEST  The following report is an over-read performed by radiologist Dr. Trudie Reed of [facility] on 03/27/2020. This over-read does not include interpretation of cardiac or coronary anatomy or pathology. The coronary calcium score interpretation by the cardiologist is attached.  COMPARISON:  None.  FINDINGS: Aortic atherosclerosis. Within the visualized portions of the thorax there are no suspicious appearing pulmonary nodules or masses, there is no acute consolidative airspace disease, no pleural effusions, no pneumothorax and no lymphadenopathy. Visualized portions of the upper abdomen are unremarkable. There are no aggressive appearing lytic or blastic lesions noted in the visualized portions of the skeleton.  IMPRESSION: 1.  Aortic Atherosclerosis (ICD10-I70.0).  Electronically Signed: By: Trudie Reed M.D. On: 03/27/2020 10:29           EKG:  NSR-personally reviewed  Recent Labs: 09/16/2021: ALT 11; BUN 9; Hemoglobin 13.0; Platelets 210; Potassium 4.4; Sodium 135; TSH 3.39 03/15/2022: Creatinine, Ser 0.70  Recent Lipid Panel No results found for: "CHOL", "TRIG", "HDL", "CHOLHDL", "VLDL", "LDLCALC", "LDLDIRECT"   Risk Assessment/Calculations:       Physical Exam:    VS:  BP 138/84   Pulse 65   Ht 5\' 6"  (1.676 m)   Wt 127 lb (57.6  kg)   SpO2 96%   BMI 20.50 kg/m     Wt Readings from Last 3 Encounters:  08/23/22 127 lb (57.6 kg)  06/22/22 121 lb 12.8 oz (55.2 kg)  06/08/22 123 lb 6.4 oz (56 kg)  GEN:  Well nourished, well developed in no acute distress HEENT: Normal NECK: No JVD; No carotid bruits CARDIAC: RRR, 1/6 systolic murmur. No rubs or gallops RESPIRATORY:  Clear to auscultation without rales, wheezing or rhonchi  ABDOMEN: Soft, non-tender, non-distended MUSCULOSKELETAL:  No edema; No deformity  SKIN: Warm and dry NEUROLOGIC:  Alert and oriented x 3 PSYCHIATRIC:  Normal affect   ASSESSMENT:    1. Essential hypertension   2. Dilation of aorta (HCC)   3. Hyperlipidemia, unspecified hyperlipidemia type   4. Mild aortic regurgitation   5. Mitral valve insufficiency, unspecified etiology   6. PVCs (premature ventricular contractions)     PLAN:    In order of problems listed above:  #Mild MR: #Mild AI: Stable in 06/2022. -Repeat TTE every 3-5 years for monitoring with next 06/2025  #Ascending aorta and aortic root dilation: -CTA aorta 03/2022 with ascending aorta 39mm (stable) -Continue serial monitoring with yearly echoes -Continue blood pressure control with irbesartan 75mg  daily  #History of PVCs: Improved. Not on nodal agents. -Continue to monitor  #HLD: Sopped red yeast rice due to GI side effects. Did not want to do statins due to concern for myalgias. Did not tolerate zetia but unsure of the side-effects she exerpienced. Ca score only 6 (39% for age-gender matched controls). Declined trying other lipid lowering therapies. -Declines cholesterol lowering therapy; wants to manage naturally  #HTN: Well controlled and at goal <130/80. -Continue irbesartan 75mg  daily -Continue amlodipine 5mg  daily as above  #Arthritis: -Follow-up with ortho as scheduled     Follow up in 1 year   Medication Adjustments/Labs and Tests Ordered: Current medicines are reviewed at length with  the patient today.  Concerns regarding medicines are outlined above.  Orders Placed This Encounter  Procedures   EKG 12-Lead   No orders of the defined types were placed in this encounter.   There are no Patient Instructions on file for this visit.

## 2022-08-12 NOTE — Telephone Encounter (Signed)
Pt called back returning [name]'s call. Told pt Kathleen Thomas's response. Pt had no questions/concerns. Call back # [phone]

## 2022-08-12 NOTE — Telephone Encounter (Signed)
No problem sent in 100 mg

## 2022-08-23 ENCOUNTER — Ambulatory Visit: Payer: No Typology Code available for payment source | Attending: Cardiology | Admitting: Cardiology

## 2022-08-23 ENCOUNTER — Encounter: Payer: Self-pay | Admitting: Cardiology

## 2022-08-23 VITALS — BP 138/84 | HR 65 | Ht 66.0 in | Wt 127.0 lb

## 2022-08-23 DIAGNOSIS — I493 Ventricular premature depolarization: Secondary | ICD-10-CM

## 2022-08-23 DIAGNOSIS — I10 Essential (primary) hypertension: Secondary | ICD-10-CM | POA: Diagnosis not present

## 2022-08-23 DIAGNOSIS — I351 Nonrheumatic aortic (valve) insufficiency: Secondary | ICD-10-CM | POA: Diagnosis not present

## 2022-08-23 DIAGNOSIS — I77819 Aortic ectasia, unspecified site: Secondary | ICD-10-CM

## 2022-08-23 DIAGNOSIS — E785 Hyperlipidemia, unspecified: Secondary | ICD-10-CM | POA: Diagnosis not present

## 2022-08-23 DIAGNOSIS — I340 Nonrheumatic mitral (valve) insufficiency: Secondary | ICD-10-CM

## 2022-08-23 NOTE — Patient Instructions (Signed)
Medication Instructions:  Your physician recommends that you continue on your current medications as directed. Please refer to the Current Medication list given to you today.  *If you need a refill on your cardiac medications before your next appointment, please call your pharmacy*  Follow-Up: At [facility], you and your health needs are our priority.  As part of our continuing mission to provide you with exceptional heart care, we have created designated Provider Care Teams.  These Care Teams include your primary Cardiologist (physician) and Advanced Practice Providers (APPs -  Physician Assistants and Nurse Practitioners) who all work together to provide you with the care you need, when you need it.  Your next appointment:   1 year(s)  Provider:   Dr Tenny Craw

## 2022-09-21 NOTE — Progress Notes (Addendum)
    Terrah Decoster T. Keundra Petrucelli, MD, CAQ Sports Medicine [facility] [address] [place], [address]  Phone: [phone]  FAX: [phone]  Kathleen Thomas - 77 y.o. female  MRN [id number]  Date of Birth: 28-Aug-1945  Date: 09/22/2022  PCP: Mort Sawyers, FNP  Referral: Mort Sawyers, FNP  Chief Complaint  Patient presents with   Cerumen Impaction   Subjective:   Kathleen Thomas is a 77 y.o. very pleasant female patient with Body mass index is 20.36 kg/m. who presents with the following:  Patient presents with a sensation of having her ears clogged.  She feels like she has had some decreased hearing over the last few months.  She does have a history of ceruminosis in the past, and she is concerned the possible obstruction from cerumen may be decreasing her hearing.  Review of Systems is noted in the HPI, as appropriate  Objective:   BP 120/70 (BP Location: Left Arm, Patient Position: Sitting, Cuff Size: Normal)   Pulse 69   Temp 99.7 F (37.6 C) (Temporal)   Ht 5\' 6"  (1.676 m)   Wt 126 lb 2 oz (57.2 kg)   SpO2 97%   BMI 20.36 kg/m   GEN: No acute distress; alert,appropriate. PULM: Breathing comfortably in no respiratory distress PSYCH: Normally interactive.   Bilateral ceruminosis ear canals.  Post flush, they are completely normal and clear.  Laboratory and Imaging Data:  Assessment and Plan:     ICD-10-CM   1. Decreased hearing of both ears  H91.93 Ambulatory referral to ENT    Ambulatory referral to Audiology    2. Bilateral impacted cerumen  H61.23      She continued to have some decreased hearing after ears were flushed.  Recommend waiting a few days to see if this recovers after elephant ear wash.  If hearing difficulties continue, then referral to audiology would be reasonable.  Cerumen removed.  Medication Management during today's office visit: No orders of the defined types were placed in this  encounter.  Medications Discontinued During This Encounter  Medication Reason   sertraline (ZOLOFT) 100 MG tablet Dose change    Orders placed today for conditions managed today: Orders Placed This Encounter  Procedures   Ambulatory referral to ENT   Ambulatory referral to Audiology    Disposition: No follow-ups on file.  [employer] speech-to-text software was used for transcription in this dictation.  Possible transcriptional errors can occur using Animal nutritionist.   Signed,  Elpidio Galea. Lylianna Fraiser, MD   Outpatient Encounter Medications as of 09/22/2022  Medication Sig   amLODipine (NORVASC) 5 MG tablet Take 5 mg by mouth daily.   Cholecalciferol (VITAMIN D3) 25 MCG (1000 UT) CAPS Take 1,000 Units by mouth daily.   irbesartan (AVAPRO) 75 MG tablet Take 75 mg by mouth.   sertraline (ZOLOFT) 50 MG tablet Take 50 mg by mouth daily.   [DISCONTINUED] sertraline (ZOLOFT) 100 MG tablet Take 1 tablet (100 mg total) by mouth daily.   No facility-administered encounter medications on file as of 09/22/2022.

## 2022-09-22 ENCOUNTER — Encounter: Payer: Self-pay | Admitting: Family Medicine

## 2022-09-22 ENCOUNTER — Ambulatory Visit: Payer: No Typology Code available for payment source | Admitting: Family Medicine

## 2022-09-22 VITALS — BP 120/70 | HR 69 | Temp 99.7°F | Ht 66.0 in | Wt 126.1 lb

## 2022-09-22 DIAGNOSIS — H6123 Impacted cerumen, bilateral: Secondary | ICD-10-CM | POA: Diagnosis not present

## 2022-09-22 DIAGNOSIS — H9193 Unspecified hearing loss, bilateral: Secondary | ICD-10-CM

## 2022-09-29 ENCOUNTER — Telehealth: Payer: Self-pay | Admitting: Family

## 2022-09-29 NOTE — Telephone Encounter (Signed)
Called patient and reviewed all information. Patient verbalized understanding. Will call if any further questions.  

## 2022-09-29 NOTE — Telephone Encounter (Signed)
OK.  ENT consultation made.

## 2022-09-29 NOTE — Addendum Note (Signed)
Addended by: Hannah Beat on: 09/29/2022 01:19 PM   Modules accepted: Orders

## 2022-09-29 NOTE — Telephone Encounter (Signed)
Patient contacted the office regarding visit from 8/21, patient says she is still having trouble with her ears and states they still feel clogged. Patient was wondering if she could have a referral sent to ENT, sent to pcp pool and provider who saw her last week

## 2022-10-01 NOTE — Addendum Note (Signed)
Addended by: Hannah Beat on: 10/01/2022 11:16 AM   Modules accepted: Orders

## 2022-10-29 ENCOUNTER — Other Ambulatory Visit: Payer: Self-pay

## 2022-10-29 ENCOUNTER — Telehealth: Payer: Self-pay

## 2022-10-29 MED ORDER — IRBESARTAN 75 MG PO TABS: 75.0000 mg | ORAL_TABLET | Freq: Every day | ORAL | 2 refills | Status: DC

## 2022-10-29 MED ORDER — AMLODIPINE BESYLATE 5 MG PO TABS: 5.0000 mg | ORAL_TABLET | Freq: Every day | ORAL | 3 refills | Status: DC

## 2022-10-29 NOTE — Telephone Encounter (Signed)
Pt's medication was sent to pt's pharmacy as requested. Confirmation received.  °

## 2022-10-29 NOTE — Telephone Encounter (Signed)
Per the pts last OV with her former Cardiologist  Dr. Shari Prows, she noted for the pt to continue irbesartan 75 mg po daily.  Dr. Shari Prows is no longer with the practice and pts new Cardiologist with our practice is Dr. Tenny Craw.   Our practice never had her on 150 mg of irbesartan, only 75 mg po daily.  If pt received the 150 mg dose from an outside Provider, she should call their office to inquire about the refill for this dose.   It pt is following our instructions and taking irbesartan 75 mg po daily, ok to refill under her new Cardiologist Dr. Dietrich Pates.

## 2022-10-29 NOTE — Telephone Encounter (Signed)
Patient's pharmacy is requesting a refill on Irbesartan 150 mg patient's shows she is on 75 mg instead are we able to refill this medication for patient

## 2022-11-22 ENCOUNTER — Ambulatory Visit: Payer: No Typology Code available for payment source | Admitting: Family

## 2022-11-22 ENCOUNTER — Encounter: Payer: Self-pay | Admitting: Family

## 2022-11-22 VITALS — BP 122/84 | HR 66 | Temp 97.8°F | Ht 66.0 in | Wt 131.0 lb

## 2022-11-22 DIAGNOSIS — R7989 Other specified abnormal findings of blood chemistry: Secondary | ICD-10-CM | POA: Diagnosis not present

## 2022-11-22 DIAGNOSIS — Z0000 Encounter for general adult medical examination without abnormal findings: Secondary | ICD-10-CM | POA: Diagnosis not present

## 2022-11-22 DIAGNOSIS — Z23 Encounter for immunization: Secondary | ICD-10-CM

## 2022-11-22 DIAGNOSIS — I10 Essential (primary) hypertension: Secondary | ICD-10-CM

## 2022-11-22 DIAGNOSIS — E559 Vitamin D deficiency, unspecified: Secondary | ICD-10-CM | POA: Diagnosis not present

## 2022-11-22 DIAGNOSIS — F411 Generalized anxiety disorder: Secondary | ICD-10-CM

## 2022-11-22 DIAGNOSIS — E782 Mixed hyperlipidemia: Secondary | ICD-10-CM

## 2022-11-22 DIAGNOSIS — Z0001 Encounter for general adult medical examination with abnormal findings: Secondary | ICD-10-CM

## 2022-11-22 NOTE — Assessment & Plan Note (Signed)
Pt wishes to continue on sertraline  She might try to decrease on her own, did advise her to decrease to 25 mg once daily from 50 mg  Did discuss with her I suspect she might be allergic to sertraline however she will consider if she wants to stay on this or not moving forward. Does not want to try another medication at this time.

## 2022-11-22 NOTE — Assessment & Plan Note (Signed)
Patient Counseling(The following topics were reviewed):  Preventative care handout given to pt  Health maintenance and immunizations reviewed. Please refer to Health maintenance section. Pt advised on safe sex, wearing seatbelts in car, and proper nutrition labwork ordered today for annual Dental health: Discussed importance of regular tooth brushing, flossing, and dental visits. Shingles x first dose today  Second dose within 2-6 months  Declines dexa

## 2022-11-22 NOTE — Patient Instructions (Addendum)
  Stop by the lab prior to leaving today. I will notify you of your results once received.   Recommendations on keeping yourself healthy:  - Exercise at least 30-45 minutes a day, 3-4 days a week.  - Eat a low-fat diet with lots of fruits and vegetables, up to 7-9 servings per day.  - Seatbelts can save your life. Wear them always.  - Smoke detectors on every level of your home, check batteries every year.  - Eye Doctor - have an eye exam every 1-2 years  - Safe sex - if you may be exposed to STDs, use a condom.  - Alcohol -  If you drink, do it moderately, less than 2 drinks per day.  - Health Care Power of Attorney. Choose someone to speak for you if you are not able.  - Depression is common in our stressful world.If you're feeling down or losing interest in things you normally enjoy, please come in for a visit.  - Violence - If anyone is threatening or hurting you, please call immediately.  Due to recent changes in healthcare laws, you may see results of your imaging and/or laboratory studies on MyChart before I have had a chance to review them.  I understand that in some cases there may be results that are confusing or concerning to you. Please understand that not all results are received at the same time and often I may need to interpret multiple results in order to provide you with the best plan of care or course of treatment. Therefore, I ask that you please give me 2 business days to thoroughly review all your results before contacting my office for clarification. Should we see a critical lab result, you will be contacted sooner.   I will see you again in one year for your annual comprehensive exam unless otherwise stated and or with acute concerns.  It was a pleasure seeing you today! Please do not hesitate to reach out with any questions and or concerns.  Regards,   Yarah Fuente    

## 2022-11-22 NOTE — Assessment & Plan Note (Signed)
Continue amlodipine 5 mg once daily  Stable.  Low sodium diet

## 2022-11-22 NOTE — Assessment & Plan Note (Signed)
Ordered vitamin d pending results.  Did recommend due to age and bone health reasons, start otc vit d and calcium daily

## 2022-11-22 NOTE — Progress Notes (Signed)
Subjective:  Patient ID: Kathleen Thomas, female    DOB: [birth year]  Age: 77 y.o. MRN: [id number]  Patient Care Team: Mort Sawyers, FNP as PCP - General (Family Medicine)   CC:  Chief Complaint  Patient presents with   Annual Exam    Wants to discuss stopping Zoloft    HPI Kathleen Thomas is a 77 y.o. female who presents today for an annual physical exam. She reports consuming a general diet. The patient does not participate in regular exercise at present. She generally feels well. She reports sleeping well. She does have additional problems to discuss today.   Vision:Within last year Dental:Receives regular dental care  Mammogram: declines, last one 2017 Colonoscopy:> 39 y/o Bone density scan: has been several years.   Pt is with acute concerns.   GAD: situational anxiety, states slightly better however not terrible improvement as she is still a caregiver. She does feel her anxiety is improving slightly. She tried to increase sertraline 100 mg but with itching and rash. She went back down to 50 mg once daily and slightly better but still with some itching, but much less and she tinks maybe she has 'dry skin'. She has her son living at home who was using alcohol but he has been sober x 6 months which has been with some improvement in the situation as well. Seeing a Librarian, academic.   Wt Readings from Last 3 Encounters:  11/22/22 131 lb (59.4 kg)  09/22/22 126 lb 2 oz (57.2 kg)  08/23/22 127 lb (57.6 kg)   Seeing a functional neurologist for her joint pains. She often has joint pains, has been for years. ANA workup was negative 2023. She states she notices her joint pains that fluctuate with her mental state.  She takes a MVI  Has been seeing them since 2022.      Advanced Directives Patient does have advanced directives. She does not have a copy in the electronic medical record.   DEPRESSION SCREENING    05/27/2022    7:19 AM 05/20/2022    9:16 AM 03/16/2022   12:55  PM 01/19/2022    9:03 AM 02/27/2020   11:08 AM  PHQ 2/9 Scores  PHQ - 2 Score  0 0 1 0  PHQ- 9 Score   0 2   Exception Documentation Patient refusal         ROS: Negative unless specifically indicated above in HPI.    Current Outpatient Medications:    amLODipine (NORVASC) 5 MG tablet, Take 1 tablet (5 mg total) by mouth daily., Disp: 90 tablet, Rfl: 3   Cholecalciferol (VITAMIN D3) 25 MCG (1000 UT) CAPS, Take 1,000 Units by mouth daily., Disp: , Rfl:    irbesartan (AVAPRO) 75 MG tablet, Take 1 tablet (75 mg total) by mouth daily., Disp: 90 tablet, Rfl: 2   sertraline (ZOLOFT) 50 MG tablet, Take 50 mg by mouth daily., Disp: , Rfl:     Objective:    BP 122/84 (BP Location: Left Arm, Patient Position: Sitting, Cuff Size: Normal)   Pulse 66   Temp 97.8 F (36.6 C) (Oral)   Ht 5\' 6"  (1.676 m)   Wt 131 lb (59.4 kg)   SpO2 96%   BMI 21.14 kg/m   BP Readings from Last 3 Encounters:  11/22/22 122/84  09/22/22 120/70  08/23/22 138/84      Physical Exam Constitutional:      General: She is not in acute distress.    Appearance:  Normal appearance. She is normal weight. She is not ill-appearing.  HENT:     Head: Normocephalic.     Right Ear: Tympanic membrane normal.     Left Ear: Tympanic membrane normal.     Nose: Nose normal.     Mouth/Throat:     Mouth: Mucous membranes are moist.  Eyes:     Extraocular Movements: Extraocular movements intact.     Pupils: Pupils are equal, round, and reactive to light.  Cardiovascular:     Rate and Rhythm: Normal rate and regular rhythm.  Pulmonary:     Effort: Pulmonary effort is normal.     Breath sounds: Normal breath sounds.  Abdominal:     General: Abdomen is flat. Bowel sounds are normal.     Palpations: Abdomen is soft.     Tenderness: There is no guarding or rebound.  Musculoskeletal:        General: Normal range of motion.     Cervical back: Normal range of motion.  Skin:    General: Skin is warm.     Capillary Refill:  Capillary refill takes less than 2 seconds.  Neurological:     General: No focal deficit present.     Mental Status: She is alert.  Psychiatric:        Mood and Affect: Mood normal.        Behavior: Behavior normal.        Thought Content: Thought content normal.        Judgment: Judgment normal.          Assessment & Plan:  Low vitamin D level  Primary hypertension Assessment & Plan: Continue amlodipine 5 mg once daily  Stable.  Low sodium diet  Orders: -     Basic metabolic panel -     Microalbumin / creatinine urine ratio  Mixed hyperlipidemia Assessment & Plan: Ordered lipid panel, pending results. Work on low cholesterol diet and exercise as tolerated   Orders: -     Lipid panel  Vitamin D deficiency Assessment & Plan: Ordered vitamin d pending results.  Did recommend due to age and bone health reasons, start otc vit d and calcium daily  Orders: -     VITAMIN D 25 Hydroxy (Vit-D Deficiency, Fractures)  GAD (generalized anxiety disorder) Assessment & Plan: Pt wishes to continue on sertraline  She might try to decrease on her own, did advise her to decrease to 25 mg once daily from 50 mg  Did discuss with her I suspect she might be allergic to sertraline however she will consider if she wants to stay on this or not moving forward. Does not want to try another medication at this time.    Encounter for general adult medical examination with abnormal findings Assessment & Plan: Patient Counseling(The following topics were reviewed):  Preventative care handout given to pt  Health maintenance and immunizations reviewed. Please refer to Health maintenance section. Pt advised on safe sex, wearing seatbelts in car, and proper nutrition labwork ordered today for annual Dental health: Discussed importance of regular tooth brushing, flossing, and dental visits. Shingles x first dose today  Second dose within 2-6 months  Declines dexa   Orders: -     VITAMIN D  25 Hydroxy (Vit-D Deficiency, Fractures) -     Basic metabolic panel -     Lipid panel -     Microalbumin / creatinine urine ratio -     CBC  Other orders -  Varicella-zoster vaccine IM      Follow-up: Return in about 6 months (around 05/23/2023) for f/u anxiety .   Mort Sawyers, FNP

## 2022-11-22 NOTE — Assessment & Plan Note (Signed)
Ordered lipid panel, pending results. Work on low cholesterol diet and exercise as tolerated  

## 2022-11-23 ENCOUNTER — Encounter (INDEPENDENT_AMBULATORY_CARE_PROVIDER_SITE_OTHER): Payer: Self-pay

## 2022-11-23 ENCOUNTER — Ambulatory Visit (INDEPENDENT_AMBULATORY_CARE_PROVIDER_SITE_OTHER): Payer: No Typology Code available for payment source | Admitting: Audiology

## 2022-11-23 ENCOUNTER — Ambulatory Visit (INDEPENDENT_AMBULATORY_CARE_PROVIDER_SITE_OTHER): Payer: No Typology Code available for payment source | Admitting: Otolaryngology

## 2022-11-23 VITALS — Ht 62.0 in | Wt 131.0 lb

## 2022-11-23 DIAGNOSIS — H6123 Impacted cerumen, bilateral: Secondary | ICD-10-CM | POA: Diagnosis not present

## 2022-11-23 DIAGNOSIS — H903 Sensorineural hearing loss, bilateral: Secondary | ICD-10-CM

## 2022-11-23 LAB — BASIC METABOLIC PANEL
BUN: 11 mg/dL (ref 7–25)
CO2: 26 mmol/L (ref 20–32)
Calcium: 9.4 mg/dL (ref 8.6–10.4)
Chloride: 102 mmol/L (ref 98–110)
Creat: 0.61 mg/dL (ref 0.60–1.00)
Glucose, Bld: 93 mg/dL (ref 65–99)
Potassium: 4.4 mmol/L (ref 3.5–5.3)
Sodium: 138 mmol/L (ref 135–146)

## 2022-11-23 LAB — CBC
HCT: 38.3 % (ref 35.0–45.0)
Hemoglobin: 12.1 g/dL (ref 11.7–15.5)
MCH: 28.2 pg (ref 27.0–33.0)
MCHC: 31.6 g/dL — ABNORMAL LOW (ref 32.0–36.0)
MCV: 89.3 fL (ref 80.0–100.0)
MPV: 11.2 fL (ref 7.5–12.5)
Platelets: 182 10*3/uL (ref 140–400)
RBC: 4.29 10*6/uL (ref 3.80–5.10)
RDW: 13.6 % (ref 11.0–15.0)
WBC: 4.6 10*3/uL (ref 3.8–10.8)

## 2022-11-23 LAB — LIPID PANEL
Cholesterol: 208 mg/dL — ABNORMAL HIGH (ref <200)
HDL: 70 mg/dL (ref >=50)
LDL Cholesterol (Calc): 122 mg/dL — ABNORMAL HIGH
Non-HDL Cholesterol (Calc): 138 mg/dL — ABNORMAL HIGH (ref <130)
Total CHOL/HDL Ratio: 3 (calc) (ref <5.0)
Triglycerides: 69 mg/dL (ref <150)

## 2022-11-23 LAB — MICROALBUMIN / CREATININE URINE RATIO
Creatinine, Urine: 29 mg/dL (ref 20–275)
Microalb Creat Ratio: 14 mg/g{creat} (ref <30)
Microalb, Ur: 0.4 mg/dL

## 2022-11-23 LAB — VITAMIN D 25 HYDROXY (VIT D DEFICIENCY, FRACTURES): Vit D, 25-Hydroxy: 25 ng/mL — ABNORMAL LOW (ref 30–100)

## 2022-11-23 NOTE — Progress Notes (Unsigned)
Dear Dr. Alfonse Alpers, Here is my assessment for our mutual patient, Kathleen Thomas. Thank you for allowing me the opportunity to care for your patient. Please do not hesitate to contact me should you have any other questions. Sincerely, Dr. Jovita Kussmaul  Otolaryngology Clinic Note Referring provider: Dr. Alfonse Alpers HPI:  Kathleen Thomas is a 77 y.o. female kindly referred by Dr. Alfonse Alpers for evaluation of cerumen impaction/ear cleaning  Saw Dr. Patsy Lager in August 2024 Patient presents with a sensation of having her ears clogged.   She feels like she has had some decreased hearing over the last few months.  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   EGD - not ablated, not bleeing was incidental   Cherry angioma    Depression    recurrent   Dermatitis    Hallux valgus (acquired), left foot    with bunions   Hammer toes of both feet    Helicobacter pylori infection    Hyperlipidemia    Hypertension    Leukopenia    mild   Mitral regurgitation    mild   Osteopenia    Osteoporosis    Pure hypercholesterolemia    Scoliosis    Seborrheic keratoses    Vitamin D deficiency      Past Surgical History:  Procedure Laterality Date   BUNIONECTOMY Left    childbirth     x2   COLONOSCOPY     TONSILLECTOMY     TUBAL LIGATION      Family History  Problem Relation Age of Onset   Atrial fibrillation Mother    Congestive Heart Failure Mother    Congestive Heart Failure Father    Stomach cancer Paternal Grandfather    Epilepsy Son        mild, has hx of seizure    Alcohol abuse Son    Colon polyps Neg Hx    Colon cancer Neg Hx    Esophageal cancer Neg Hx    Rectal cancer Neg Hx      Social Connections: Not on file    Tobacco: ***. Alcohol: ***. Occupation: ***. Lives in *** with ***.   Current Outpatient Medications:    amLODipine (NORVASC) 5 MG tablet, Take 1 tablet (5 mg total) by mouth daily., Disp: 90 tablet, Rfl: 3   Cholecalciferol (VITAMIN D3) 25 MCG (1000 UT) CAPS, Take 1,000 Units by mouth daily., Disp: , Rfl:    irbesartan (AVAPRO) 75 MG tablet, Take 1 tablet (75 mg total) by mouth daily., Disp: 90 tablet, Rfl: 2   sertraline (ZOLOFT) 50 MG tablet, Take 50 mg by mouth daily., Disp: , Rfl:    Physical Exam:   There were no vitals taken for this visit. ***  Salient findings:  CN II-XII intact *** Bilateral EAC clear and TM intact with well pneumatized middle ear spaces Weber 512: *** Rinne 512: AC > BC b/l *** Rine 1024: AC > BC b/l *** Anterior rhinoscopy: Septum ***; bilateral inferior turbinates with *** No lesions of oral cavity/oropharynx; dentition *** No obviously palpable  neck masses/lymphadenopathy/thyromegaly No respiratory distress or stridor***  Procedures:  None***  Impression & Plans:  Kathleen Thomas is a 77 y.o. female with ***   - f/u ***   Thank you for allowing me the opportunity to care for your patient. Please do not hesitate to contact me should you have any other questions.  Sincerely, Jovita Kussmaul, MD Otolarynoglogist (ENT), [facility] Phone: [phone] Fax: [phone]  11/23/2022, 8:16 AM

## 2022-11-23 NOTE — Progress Notes (Signed)
  [address] [place], [place] [address] [phone]  Audiological Evaluation    Name: Kathleen Thomas     DOB:   17-Sep-1945      MRN:   [id number]                                                                                     Service Date: 11/23/2022     Accompanied by: none   Patient comes today after Dr. Allena Katz, ENT sent a referral for a hearing evaluation due to concerns with hearing changes noted 6 months ago.   Symptoms Yes Details  Hearing loss  [x]  Both ears  Tinnitus  [x]  Whooshing sound in both ears that seems to be less noticeable after she had her ear cleaned.   Ear pain/ Ear infections  []    Balance problems  [x]  Reports that she thinks it is related to her joint pain.  Noise exposure  [x]  Started going to some exercises classes where they play loud music. Uses the tractor at home- reportedly uses hearing protection.l  Previous ear surgeries  []    Family history  [x]  Mother lost her hearing with age  Amplification  []    Other  []      Otoscopy: Right ear: Clear external ear canals and notable landmarks visualized on the tympanic membrane. Left ear:  Clear external ear canals and notable landmarks visualized on the tympanic membrane.  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  The hearing test results were completed under headphones and re-checked with inserts and results are deemed to be of good reliability. Test technique:  conventional     Speech Audiometry: Right ear- Speech Reception Threshold (SRT) was obtained at 30 dBHL Left ear-Speech Reception  Threshold (SRT) was obtained at 30 dBHL   Word Recognition Score Tested using NU-6 (MLV) Right ear: 76% was obtained at a presentation level of 75 dBHL with contralateral masking which is deemed as  fair Left ear: 76% was obtained at a presentation level of 75 dBHL with contralateral masking which is deemed as  fair    Impression: There is not a significant difference in pure-tone thresholds between ears. There is not a significant difference in the word recognition score in between ears.    Recommendations: Follow up with ENT as scheduled for today. Return for a hearing evaluation if concerns with hearing changes arise or per MD recommendation. Consider a communication needs assessment after medical clearance for hearing aids is obtained.   Kathleen Thomas, AUD

## 2022-11-29 ENCOUNTER — Telehealth: Payer: Self-pay | Admitting: Family

## 2022-11-29 MED ORDER — VITAMIN D (ERGOCALCIFEROL) 1.25 MG (50000 UNIT) PO CAPS: 50000.0000 [IU] | ORAL_CAPSULE | ORAL | 0 refills | Status: AC

## 2022-11-29 NOTE — Telephone Encounter (Signed)
Mort Sawyers, FNP-C Your vitamin D was a bit on the lower range. I will send an RX for vitamin D3 50,000 IU which you will take once weekly for 8 weeks. Once RX is complete, please continue over the counter Vitamin D3 2000 IU once daily.  ------------------------------------------- Rx was not sent in for Vitamin D. This has been sent to her preferred pharmacy. Pt is aware and I apologized for the confusion. Nothing further was needed.

## 2022-11-29 NOTE — Telephone Encounter (Signed)
Patient called in regarding her RX for vitamin D. She stated that the pharmacy hasn't received anything. Thank you!

## 2023-01-19 IMAGING — CT CT ANGIO CHEST
3 of 8 series · 18 of 46 positions shown · IV contrast (OMNIPAQUE 350)
Comparison: CT cardiac 03/27/2020

CLINICAL DATA: Routine follow up ascending aortic aneurysm. Hx of
HTN.

EXAM:
CT ANGIOGRAPHY CHEST WITH CONTRAST
TECHNIQUE: Multidetector CT imaging of the chest was performed using the
standard protocol during bolus administration of intravenous
contrast. Multiplanar CT image reconstructions and MIPs were
obtained to evaluate the vascular anatomy.

[Series 4: aorta 3.0 bf37 2 · axial · 0.61mm/px · z∈[-294,-34]mm · 13 of 103 slices shown]
[im 8/103  lung]
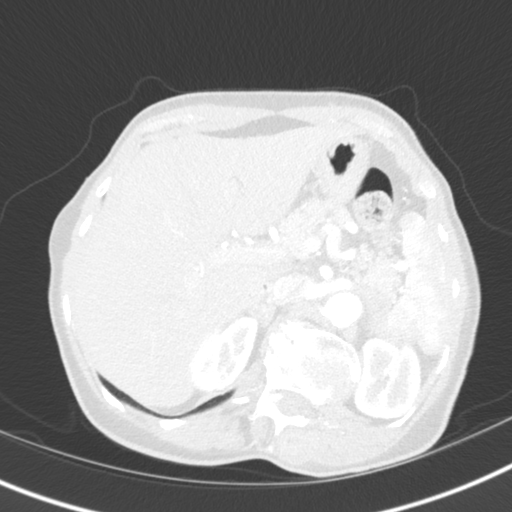
[im 15/103  soft-tissue]
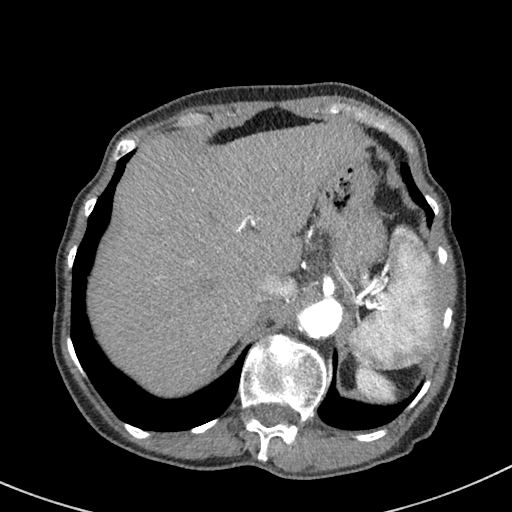
[im 22/103  lung]
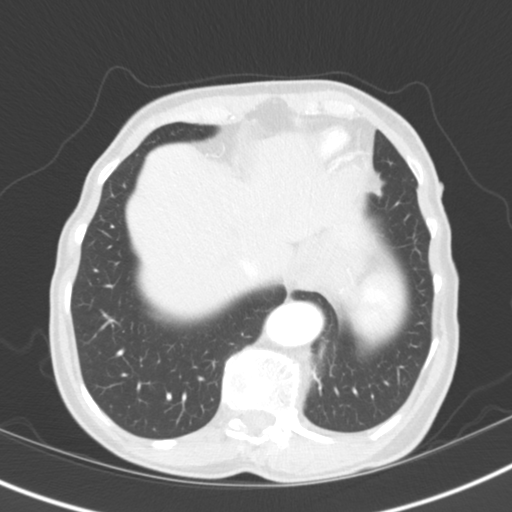
[im 30/103  soft-tissue]
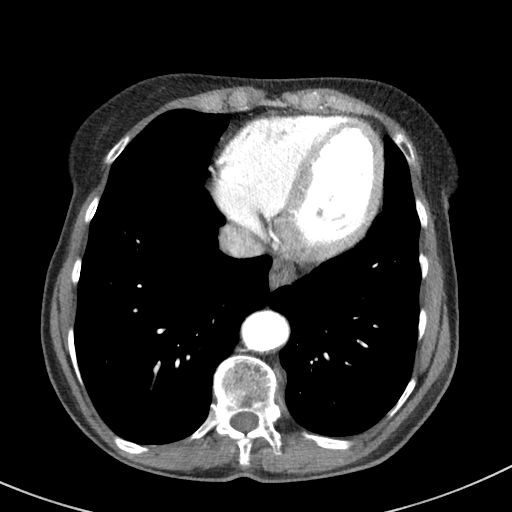
[im 37/103  lung]
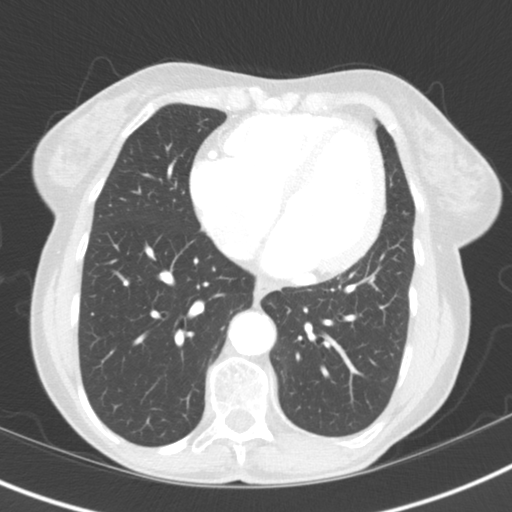
[im 44/103  soft-tissue]
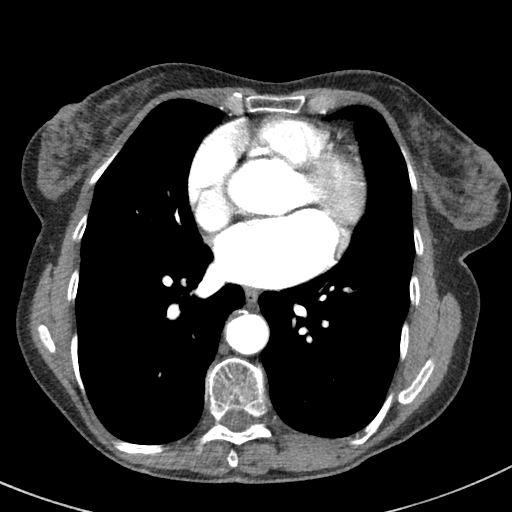
[im 52/103  lung]
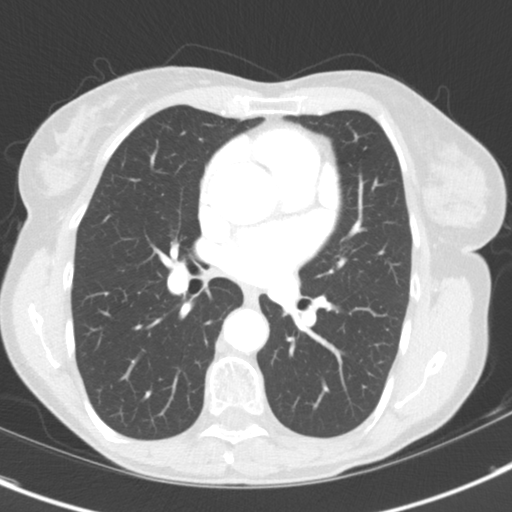
[im 59/103  soft-tissue]
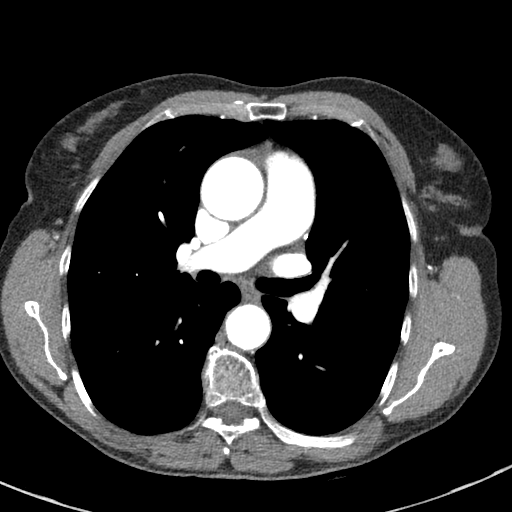
[im 66/103  lung]
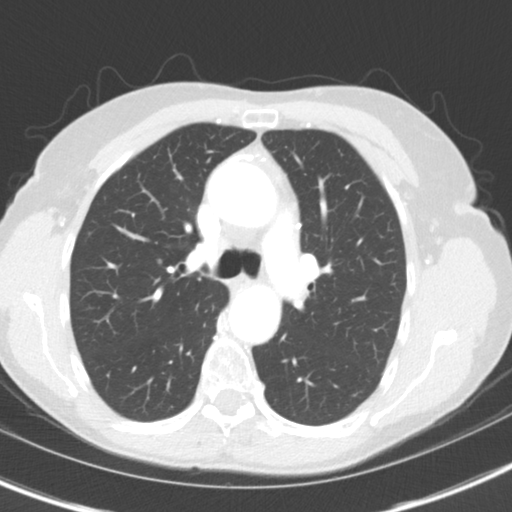
[im 73/103  soft-tissue]
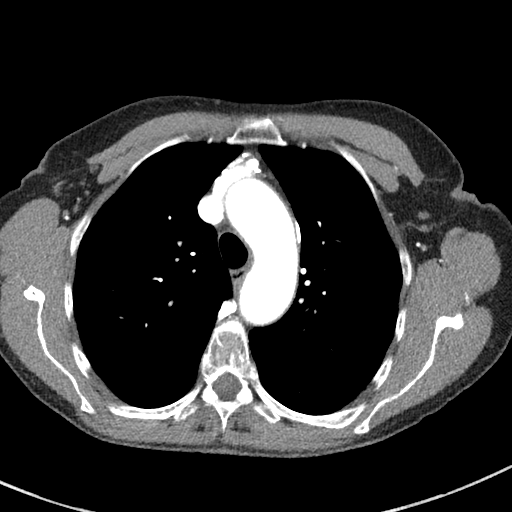
[im 81/103  lung]
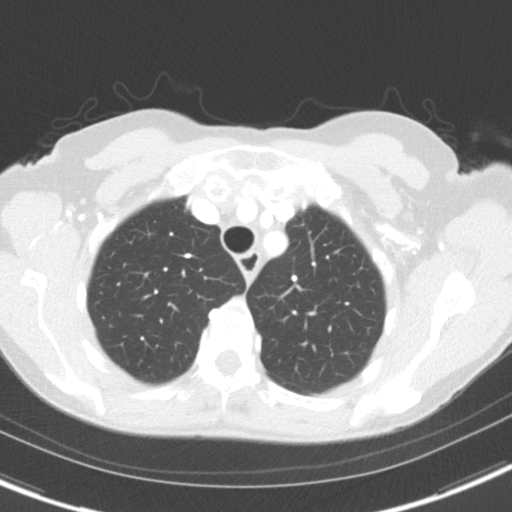
[im 88/103  soft-tissue]
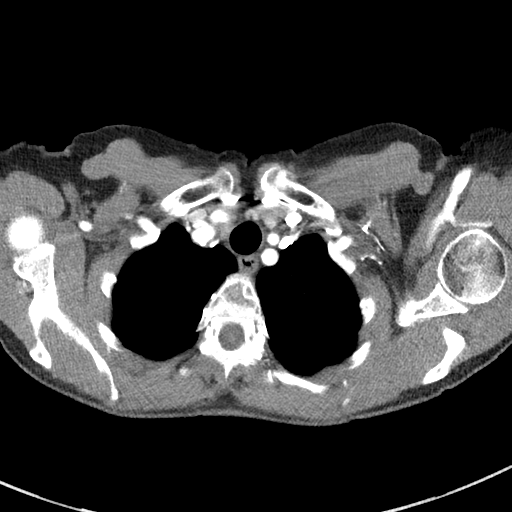
[im 95/103  lung]
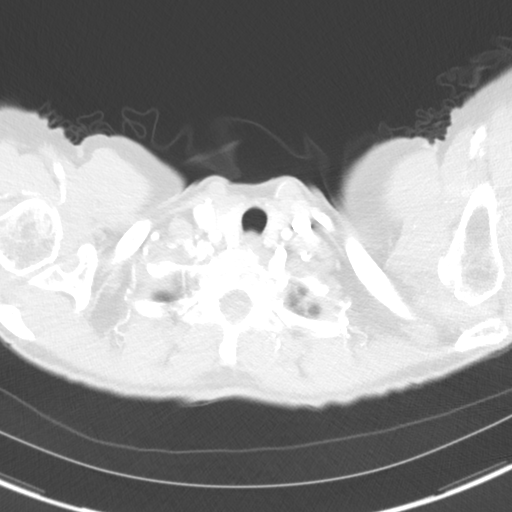

[Series 5: lung · axial · 0.61mm/px · z∈[-294,-252]mm · 2 of 103 slices shown]
[im 8/103  soft-tissue]
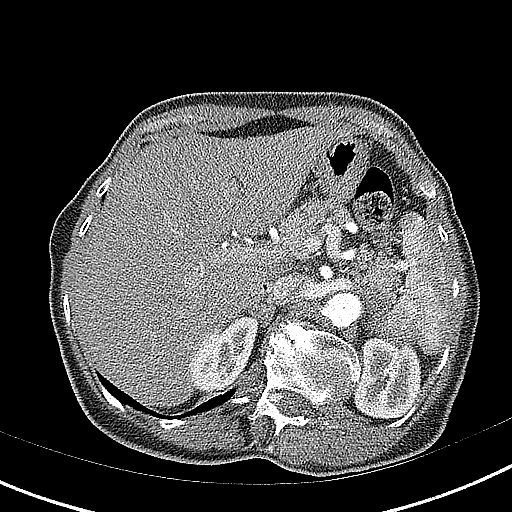
[im 22/103  soft-tissue]
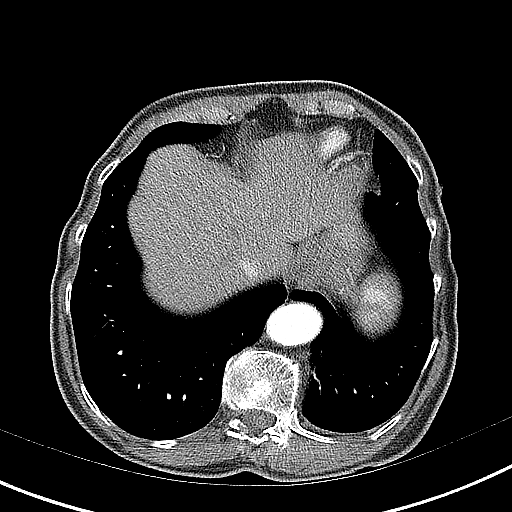

[Series 7: coronals · coronal · 0.59mm/px · 3 of 124 slices shown]
[im 31/124  soft-tissue]
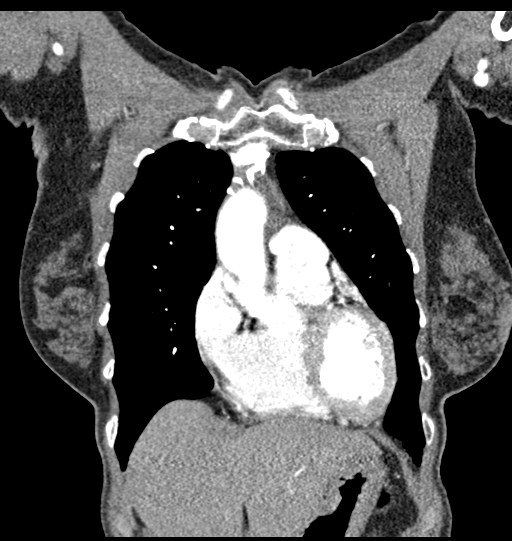
[im 62/124  soft-tissue]
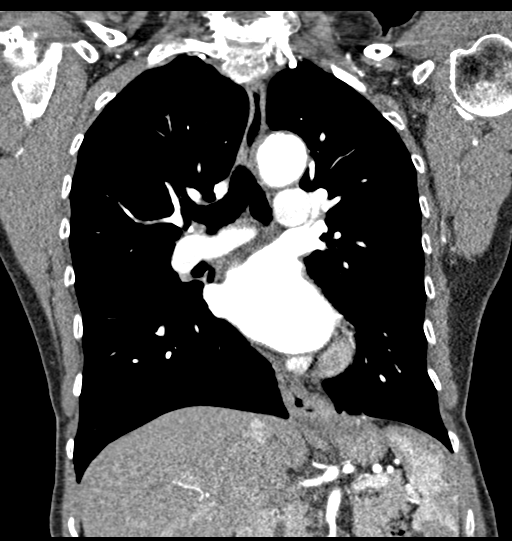
[im 93/124  soft-tissue]
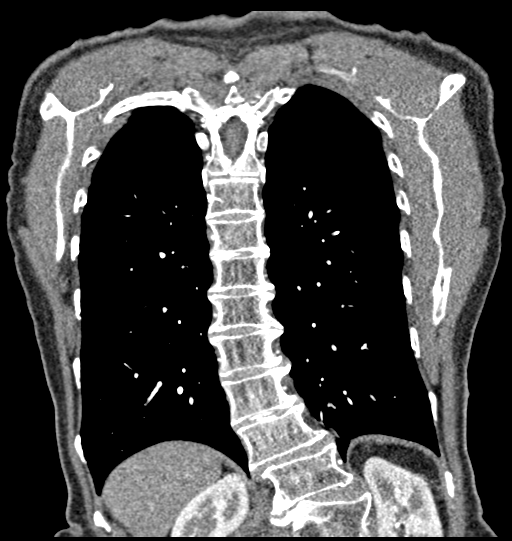

[18 of 46 positions shown; findings below may reference images not displayed]

RADIATION DOSE REDUCTION: This exam was performed according to the
departmental dose-optimization program which includes automated
exposure control, adjustment of the mA and/or kV according to
patient size and/or use of iterative reconstruction technique.

CONTRAST:  80mL OMNIPAQUE IOHEXOL 350 MG/ML SOLN
FINDINGS: Cardiovascular:

Preferential opacification of the thoracic aorta. No evidence of
thoracic aortic aneurysm or dissection. The ascending thoracic aorta
is stable in caliber measuring up to 3.9 cm. At least moderate
atherosclerotic plaque of the thoracic aorta. Coronary artery
calcifications.

Prominent heart size.  No significant pericardial effusion.

The main pulmonary artery is borderline enlarged in caliber
measuring up to 3 cm. No central or proximal segmental pulmonary
embolus.

Mediastinum/Nodes: No enlarged mediastinal, hilar, or axillary lymph
nodes. Thyroid gland, trachea, and esophagus demonstrate no
significant findings. A small hiatal hernia.

Lungs/Pleura: No focal consolidation. Stable 5 mm pulmonary nodule
within left upper lobe ([DATE]). Stable punctate pulmonary nodule
within the right lower lobe. No new pulmonary nodule. No pulmonary
mass. No pleural effusion. No pneumothorax.

Upper Abdomen: Slightly more conspicuous due to timing of contrast
nonspecific partially visualized cystic lesion of the spleen. Likely
subcentimeter flash filling hemangioma of the right hepatic lobe
([DATE]).

Musculoskeletal:

No chest wall abnormality.

No suspicious lytic or blastic osseous lesions. No acute displaced
fracture. Multilevel degenerative changes of the spine.

Review of the MIP images confirms the above findings.
IMPRESSION: 1. Stable ascending aorta caliber measuring up to 3.9 cm. No
definite thoracic aorta aneurysm.
2. Aortic Atherosclerosis ([id number].X).
3. Small hiatal hernia.
4. Slightly more conspicuous due to timing of contrast nonspecific
partially visualized cystic lesion of the spleen.

## 2023-01-24 ENCOUNTER — Other Ambulatory Visit: Payer: Self-pay | Admitting: Family

## 2023-03-06 NOTE — Progress Notes (Signed)
 Cardiology Office Note:    Date:  03/08/2023   ID:  Kathleen Thomas, DOB October 07, 1945, MRN [id number]  PCP:  Corwin Antu, FNP   [facility] Providers Cardiologist:  None {    Pt presents for follow up of CAD and palpitations    History of Present Illness:    Kathleen Thomas is a 78 y.o. female with a hx of anxiety, depression, palpitations, mild AI, mild AR, and palpitations who was previously followed by Dr. Maranda who now returns to clinic for follow-up.  2021 TTE 2021 LVEF 60-65%, with mild AI, mild MR, ascending aorta 40mm, aortic root 37mm  2021 Ca score CT  Score was 6   Aorta 39 mm    2024  TTE LVEF 55-60%, normal RV, mild MR, mild AR, ascending aorta  The pt was previously seen by Kathleen Thomas   Last seen in 2024 She denies CP  Breating is good   No dizziness  No palpitations Pt complains of knee pain    Mostly at night   Hurt when rests    DIet:   Breakfast:  bacon and eggs   Oatmeal  Plain greek yogurt  with fruit and honey Bread  Water  Coffee with cream Juice Lunch/dinner   Meat and veggies or mac/cheese  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   EGD - not ablated, not bleeing was incidental   Cherry angioma    Depression    recurrent   Dermatitis    Hallux valgus (acquired), left foot    with bunions   Hammer toes of both feet    Helicobacter pylori infection    Hyperlipidemia    Hypertension    Leukopenia    mild   Mitral regurgitation    mild   Osteopenia    Osteoporosis    Pure hypercholesterolemia    Scoliosis    Seborrheic keratoses    Vitamin D  deficiency     Past Surgical History:  Procedure Laterality Date   BUNIONECTOMY Left    childbirth     x2   COLONOSCOPY     TONSILLECTOMY     TUBAL LIGATION      Current Medications: Current Meds  Medication Sig   amLODipine  (NORVASC ) 5 MG tablet Take 1  tablet (5 mg total) by mouth daily.   Cholecalciferol (VITAMIN D3) 25 MCG (1000 UT) CAPS Take 1,000 Units by mouth daily.   irbesartan  (AVAPRO ) 75 MG tablet Take 1 tablet (75 mg total) by mouth daily.     Allergies:   Actonel [risedronate sodium], Fosamax [alendronate sodium], and Sertraline    Social History   Socioeconomic History   Marital status: Married    Spouse name: Not on file   Number of children: 2   Years of education: high school   Highest education level: Not on file  Occupational History   Not on file  Tobacco Use   Smoking status: Never    Passive exposure: Never   Smokeless tobacco: Never  Vaping Use   Vaping status: Never Used  Substance and Sexual Activity   Alcohol use: Never   Drug use: Never   Sexual activity: Not Currently    Partners: Male    Birth control/protection: Abstinence    Comment: husband in memory care  Other Topics Concern   Not on file  Social History Narrative   02/27/20   From: the area   Son with alcohol abuse. Daughter also at home.    Work: retired - public librarian       Family: 2 children - Daughter Geni and son Redell -- 3 grandchildren, Husband Alonso) - lives in a memory care unit - she still sees him      Enjoys: working outside, programme researcher, broadcasting/film/video, sewing, cooking      Exercise: walking and yardwork   Diet: limits fried foods, processed foods, tries to eat veggies/fruits, limits red meat      Safety   Seat belts: Yes    Guns: Yes  and not currently secure      Social Drivers of Corporate Investment Banker Strain: Not on file  Food Insecurity: Not on file  Transportation Needs: Not on file  Physical Activity: Not on file  Stress: Not on file  Social Connections: Not on file     Family History: The patient's family history includes Alcohol abuse in her son; Atrial fibrillation in her mother; Congestive Heart Failure in her father and mother; Epilepsy in her son; Stomach cancer in her paternal grandfather.  There is no history of Colon polyps, Colon cancer, Esophageal cancer, or Rectal cancer.  ROS:   Please see the history of present illness.  EKGs/Labs/Other Studies Reviewed:    The following studies were reviewed today: Cardiac Studies & Procedures      ECHOCARDIOGRAM  ECHOCARDIOGRAM COMPLETE 06/09/2022  Narrative ECHOCARDIOGRAM REPORT    Patient Name:   Kathleen Thomas Date of Exam: 06/09/2022 Medical Rec #:  [id number]      Height:       66.0 in Accession #:    [id number]     Weight:       123.4 lb Date of Birth:  1945/10/17      BSA:          1.629 m Patient Age:    76 years       BP:           136/87 mmHg Patient Gender: F              HR:           69 bpm. Exam Location:  [address]  Procedure: 2D Echo, Cardiac Doppler, Color Doppler and Strain Analysis  Indications:    I35.1 Aortic regurgitation  History:        Patient has prior history of Echocardiogram examinations, most recent 06/28/2019. Risk Factors:Hypertension and HLD.  Sonographer:    Waldo Guadalajara RCS Referring Phys: [id number] HEATHER E [place]  IMPRESSIONS   1. Left ventricular ejection fraction, by estimation, is 55 to 60%. The left ventricle has normal function. The left ventricle has no regional wall motion abnormalities. There is mild left ventricular hypertrophy. Left ventricular diastolic parameters were normal. 2. Right ventricular systolic function is normal. The right ventricular size is normal. There is normal pulmonary artery systolic pressure. The estimated right ventricular systolic pressure is 18.2 mmHg. 3. The mitral valve is normal in structure. Mild mitral valve regurgitation. 4. The aortic valve was not well visualized. Aortic valve regurgitation is mild. No aortic stenosis is present. 5. Aortic dilatation noted. There is dilatation of the ascending aorta, measuring 40 mm. 6. The inferior vena cava is normal in size with greater than 50% respiratory variability, suggesting right atrial  pressure of 3 mmHg.  FINDINGS Left Ventricle: Left ventricular ejection fraction, by estimation, is 55  to 60%. The left ventricle has normal function. The left ventricle has no regional wall motion abnormalities. The left ventricular internal cavity size was normal in size. There is mild left ventricular hypertrophy. Left ventricular diastolic parameters were normal.  Right Ventricle: The right ventricular size is normal. No increase in right ventricular wall thickness. Right ventricular systolic function is normal. There is normal pulmonary artery systolic pressure. The tricuspid regurgitant velocity is 1.95 m/s, and with an assumed right atrial pressure of 3 mmHg, the estimated right ventricular systolic pressure is 18.2 mmHg.  Left Atrium: Left atrial size was normal in size.  Right Atrium: Right atrial size was normal in size.  Pericardium: There is no evidence of pericardial effusion.  Mitral Valve: The mitral valve is normal in structure. Mild mitral valve regurgitation.  Tricuspid Valve: The tricuspid valve is normal in structure. Tricuspid valve regurgitation is trivial.  Aortic Valve: The aortic valve was not well visualized. Aortic valve regurgitation is mild. Aortic regurgitation PHT measures 867 msec. No aortic stenosis is present.  Pulmonic Valve: The pulmonic valve was not well visualized. Pulmonic valve regurgitation is trivial.  Aorta: The aortic root is normal in size and structure and aortic dilatation noted. There is dilatation of the ascending aorta, measuring 40 mm.  Venous: The inferior vena cava is normal in size with greater than 50% respiratory variability, suggesting right atrial pressure of 3 mmHg.  IAS/Shunts: The interatrial septum was not well visualized.   LEFT VENTRICLE PLAX 2D LVIDd:         4.10 cm   Diastology LVIDs:         2.80 cm   LV e' medial:    9.36 cm/s LV PW:         0.90 cm   LV E/e' medial:  7.2 LV IVS:        1.00 cm   LV e' lateral:    14.90 cm/s LVOT diam:     1.90 cm   LV E/e' lateral: 4.5 LV SV:         63 LV SV Index:   39        2D Longitudinal Strain LVOT Area:     2.84 cm  2D Strain GLS (A2C):   -20.7 % 2D Strain GLS (A3C):   -15.4 % 2D Strain GLS (A4C):   -15.1 % 2D Strain GLS Avg:     -17.1 %  RIGHT VENTRICLE RV Basal diam:  2.80 cm RV S prime:     11.30 cm/s TAPSE (M-mode): 1.6 cm RVSP:           18.2 mmHg  LEFT ATRIUM             Index        RIGHT ATRIUM           Index LA diam:        3.70 cm 2.27 cm/m   RA Pressure: 3.00 mmHg LA Vol (A2C):   63.2 ml 38.80 ml/m  RA Area:     10.70 cm LA Vol (A4C):   39.1 ml 24.01 ml/m  RA Volume:   22.30 ml  13.69 ml/m LA Biplane Vol: 52.6 ml 32.29 ml/m AORTIC VALVE LVOT Vmax:   101.00 cm/s LVOT Vmean:  74.400 cm/s LVOT VTI:    0.222 m AI PHT:      867 msec  AORTA Ao Root diam: 3.20 cm Ao Asc diam:  4.00 cm  MITRAL VALVE  TRICUSPID VALVE MV Area (PHT):                TR Peak grad:   15.2 mmHg MV Decel Time:                TR Vmax:        195.00 cm/s MR Peak grad:    102.4 mmHg   Estimated RAP:  3.00 mmHg MR Mean grad:    65.0 mmHg    RVSP:           18.2 mmHg MR Vmax:         506.00 cm/s MR Vmean:        382.0 cm/s   SHUNTS MR PISA:         1.57 cm     Systemic VTI:  0.22 m MR PISA Eff ROA: 10 mm       Systemic Diam: 1.90 cm MR PISA Radius:  0.50 cm MV E velocity: 67.60 cm/s MV A velocity: 68.10 cm/s MV E/A ratio:  0.99  Lonni Nanas MD Electronically signed by Lonni Nanas MD Signature Date/Time: 06/09/2022/11:29:35 AM    Final    CT SCANS  CT CARDIAC SCORING (SELF PAY ONLY) 03/27/2020  Addendum 03/27/2020  1:36 PM ADDENDUM REPORT: 03/27/2020 13:34  CLINICAL DATA:  Risk stratification  EXAM: Coronary Calcium Score  TECHNIQUE: The patient was scanned on a [employer] scanner. Axial non-contrast 3 mm slices were carried out through the heart. The data set was analyzed on a dedicated work station and  scored using the Agatson method.  FINDINGS: Non-cardiac: See separate report from [facility].  Ascending Aorta: Mildly dilated at 3.9 cm (non-contrast) - aortic atherosclerosis  Pericardium: Normal  Coronary arteries: Normal origins.  IMPRESSION: Coronary calcium score of 6. This was 39th percentile for age and sex matched control.  Dilated ascending aorta to 3.9 cm.  Aortic atherosclerosis.   Electronically Signed By: Vinie JAYSON Maxcy M.D. On: 03/27/2020 13:34  Narrative EXAM: OVER-READ INTERPRETATION  CT CHEST  The following report is an over-read performed by radiologist Dr. Toribio Aye of [facility] on 03/27/2020. This over-read does not include interpretation of cardiac or coronary anatomy or pathology. The coronary calcium score interpretation by the cardiologist is attached.  COMPARISON:  None.  FINDINGS: Aortic atherosclerosis. Within the visualized portions of the thorax there are no suspicious appearing pulmonary nodules or masses, there is no acute consolidative airspace disease, no pleural effusions, no pneumothorax and no lymphadenopathy. Visualized portions of the upper abdomen are unremarkable. There are no aggressive appearing lytic or blastic lesions noted in the visualized portions of the skeleton.  IMPRESSION: 1.  Aortic Atherosclerosis (ICD10-I70.0).  Electronically Signed: By: Toribio Aye M.D. On: 03/27/2020 10:29           EKG:  NSR-personally reviewed  Recent Labs: 11/22/2022: BUN 11; Creat 0.61; Hemoglobin 12.1; Platelets 182; Potassium 4.4; Sodium 138  Recent Lipid Panel    Component Value Date/Time   CHOL 208 (H) 11/22/2022 0837   TRIG 69 11/22/2022 0837   HDL 70 11/22/2022 0837   CHOLHDL 3.0 11/22/2022 0837   LDLCALC 122 (H) 11/22/2022 0837     Physical Exam:    VS:  BP 122/82   Pulse 83   Ht 5' 6 (1.676 m)   Wt 132 lb 3.2 oz (60 kg)   SpO2 95%   BMI 21.34 kg/m     Wt Readings  from Last 3 Encounters:  03/08/23 132 lb 3.2  oz (60 kg)  11/23/22 131 lb (59.4 kg)  11/22/22 131 lb (59.4 kg)     GEN:  Well nourished, well developed in no acute distress HEENT: Normal NECK: No JVD; No carotid bruits CARDIAC: RRR, 1/6 systolic murmur  RESPIRATORY:  Clear to auscultation  ABDOMEN: Soft, no masses  No hepatomegaly  MUSCULOSKELETAL:  No edema; No deformity  SKIN: Warm and dry NEUROLOGIC:  Alert and oriented x 3 PSYCHIATRIC:  Normal affect     PLAN:    In order of problems listed above:  1  CAD   Ca score of 6 on CT in 2022  PT without symptoms  Risk factor modifications   2  HTN   BP is controlled   3  Aortic dilitation  Stable  Plan for periodic echoes or CTs  4  Valvular dz   Mild MR,  mild AI    Follow clinically and via echo  5  Hx PVCs   Pt asymptomatic    6  HL   PT did not tolerate red yeast rice (GI problems)   Does not want statins, concernd about myalgias   Did not tolerate Zetia      Plan for trial of diet exercise   Follow up labs this summer      Follow up in 1 year

## 2023-03-08 ENCOUNTER — Encounter: Payer: Self-pay | Admitting: Internal Medicine

## 2023-03-08 ENCOUNTER — Ambulatory Visit: Payer: No Typology Code available for payment source | Attending: Internal Medicine | Admitting: Internal Medicine

## 2023-03-08 VITALS — BP 122/82 | HR 83 | Ht 66.0 in | Wt 132.2 lb

## 2023-03-08 DIAGNOSIS — I340 Nonrheumatic mitral (valve) insufficiency: Secondary | ICD-10-CM | POA: Diagnosis not present

## 2023-03-08 DIAGNOSIS — I77819 Aortic ectasia, unspecified site: Secondary | ICD-10-CM | POA: Diagnosis not present

## 2023-03-08 DIAGNOSIS — I493 Ventricular premature depolarization: Secondary | ICD-10-CM

## 2023-03-08 DIAGNOSIS — I351 Nonrheumatic aortic (valve) insufficiency: Secondary | ICD-10-CM | POA: Diagnosis not present

## 2023-03-08 NOTE — Patient Instructions (Addendum)
Medication Instructions:   *If you need a refill on your cardiac medications before your next appointment, please call your pharmacy*   Lab Work:  If you have labs (blood work) drawn today and your tests are completely normal, you will receive your results only by: MyChart Message (if you have MyChart) OR A paper copy in the mail If you have any lab test that is abnormal or we need to change your treatment, we will call you to review the results.   Testing/Procedures: December 2025 Your physician has requested that you have an echocardiogram. Echocardiography is a painless test that uses sound waves to create images of your heart. It provides your doctor with information about the size and shape of your heart and how well your heart's chambers and valves are working. This procedure takes approximately one hour. There are no restrictions for this procedure. Please do NOT wear cologne, perfume, aftershave, or lotions (deodorant is allowed). Please arrive 15 minutes prior to your appointment time.  Please note: We ask at that you not bring children with you during ultrasound (echo/ vascular) testing. Due to room size and safety concerns, children are not allowed in the ultrasound rooms during exams. Our front office staff cannot provide observation of children in our lobby area while testing is being conducted. An adult accompanying a patient to their appointment will only be allowed in the ultrasound room at the discretion of the ultrasound technician under special circumstances. We apologize for any inconvenience.    Follow-Up: At [facility], you and your health needs are our priority.  As part of our continuing mission to provide you with exceptional heart care, we have created designated Provider Care Teams.  These Care Teams include your primary Cardiologist (physician) and Advanced Practice Providers (APPs -  Physician Assistants and Nurse Practitioners) who all work together to  provide you with the care you need, when you need it.  We recommend signing up for the patient portal called "MyChart".  Sign up information is provided on this After Visit Summary.  MyChart is used to connect with patients for Virtual Visits (Telemedicine).  Patients are able to view lab/test results, encounter notes, upcoming appointments, etc.  Non-urgent messages can be sent to your provider as well.   To learn more about what you can do with MyChart, go to [email or web address].    Your next appointment:  Feb 2025

## 2023-06-09 ENCOUNTER — Other Ambulatory Visit (HOSPITAL_COMMUNITY): Payer: No Typology Code available for payment source

## 2023-08-15 ENCOUNTER — Ambulatory Visit: Admitting: Family

## 2023-08-15 ENCOUNTER — Encounter: Payer: Self-pay | Admitting: Family

## 2023-08-15 VITALS — BP 108/66 | HR 78 | Temp 98.7°F | Ht 66.0 in | Wt 131.0 lb

## 2023-08-15 DIAGNOSIS — H903 Sensorineural hearing loss, bilateral: Secondary | ICD-10-CM | POA: Diagnosis not present

## 2023-08-15 DIAGNOSIS — H6123 Impacted cerumen, bilateral: Secondary | ICD-10-CM | POA: Insufficient documentation

## 2023-08-15 NOTE — Progress Notes (Signed)
 Established Patient Office Visit  Subjective:      CC:  Chief Complaint  Patient presents with   Ear Fullness    Would ENT referral for ear fullness and hearing whispers in ears has been going on for 1 month    HPI: Kathleen Thomas is a 78 y.o. female presenting on 08/15/2023 for Ear Fullness (Would ENT referral for ear fullness and hearing whispers in ears has been going on for 1 month)  Last seen with ENT 11/2022, Dr. Eldora Blanch Audiogram, bil ears with sensorineural hearing loss, per notes consideration for hearing aids   She also feels that her ears have been full and she can tell her hearing is not as sharp as always. She is now wearing hearing aids so this might contribute. She has had improved hearing with the hearing aids however. She doesn't use a q tip but she does push it in sometimes with her ear tips. No pain in the ears. Not otherwise sick feeling, no nasal congestion. She does report a little whooshing sound in her ears.          Social history:  Relevant past medical, surgical, family and social history reviewed and updated as indicated. Interim medical history since our last visit reviewed.  Allergies and medications reviewed and updated.  DATA REVIEWED: CHART IN EPIC     ROS: Negative unless specifically indicated above in HPI.    Current Outpatient Medications:    amLODipine  (NORVASC ) 5 MG tablet, Take 1 tablet (5 mg total) by mouth daily., Disp: 90 tablet, Rfl: 3   Cholecalciferol (VITAMIN D3) 25 MCG (1000 UT) CAPS, Take 1,000 Units by mouth daily., Disp: , Rfl:    irbesartan  (AVAPRO ) 75 MG tablet, Take 1 tablet (75 mg total) by mouth daily., Disp: 90 tablet, Rfl: 2      Objective:    BP 108/66   Pulse 78   Temp 98.7 F (37.1 C) (Temporal)   Ht 5' 6 (1.676 m)   Wt 131 lb (59.4 kg)   SpO2 97%   BMI 21.14 kg/m   Wt Readings from Last 3 Encounters:  08/15/23 131 lb (59.4 kg)  03/08/23 132 lb 3.2 oz (60 kg)  11/23/22 131 lb (59.4  kg)    Physical Exam Constitutional:      General: She is not in acute distress.    Appearance: Normal appearance. She is normal weight. She is not ill-appearing, toxic-appearing or diaphoretic.  HENT:     Head: Normocephalic.     Right Ear: Tympanic membrane normal. There is impacted cerumen.     Left Ear: Tympanic membrane normal. There is impacted cerumen.     Nose: Nose normal.     Mouth/Throat:     Mouth: Mucous membranes are dry.     Pharynx: No oropharyngeal exudate or posterior oropharyngeal erythema.  Eyes:     Extraocular Movements: Extraocular movements intact.     Pupils: Pupils are equal, round, and reactive to light.  Cardiovascular:     Rate and Rhythm: Normal rate and regular rhythm.     Pulses: Normal pulses.     Heart sounds: Normal heart sounds.  Pulmonary:     Effort: Pulmonary effort is normal.     Breath sounds: Normal breath sounds.  Musculoskeletal:     Cervical back: Normal range of motion.  Neurological:     General: No focal deficit present.     Mental Status: She is alert and oriented to person, place,  and time. Mental status is at baseline.  Psychiatric:        Mood and Affect: Mood normal.        Behavior: Behavior normal.        Thought Content: Thought content normal.        Judgment: Judgment normal.           Assessment & Plan:  Sensorineural hearing loss (SNHL) of both ears  Bilateral impacted cerumen Assessment & Plan: Ceruminosis is noted.  Obtained verbal patient consent prior to procedure, possible risks of procedure discussed with pt prior, and then Wax was removed by syringing/irrigation and manual debridement was performed by me with curette. Instructions for home care to prevent wax buildup are given and handout provided to pt .pt tolerated procedure well.       Return in about 4 months (around 12/16/2023) for f/u CPE.  Kathleen Patrick, MSN, APRN, FNP-C [facility]

## 2023-08-15 NOTE — Assessment & Plan Note (Signed)
Ceruminosis is noted.  Obtained verbal patient consent prior to procedure, possible risks of procedure discussed with pt prior, and then Wax was removed by syringing/irrigation and manual debridement was performed by me with curette. Instructions for home care to prevent wax buildup are given and handout provided to pt .pt tolerated procedure well.   

## 2023-08-28 ENCOUNTER — Other Ambulatory Visit: Payer: Self-pay | Admitting: Nurse Practitioner

## 2023-11-30 ENCOUNTER — Encounter: Admitting: Family

## 2023-12-27 ENCOUNTER — Encounter: Payer: Self-pay | Admitting: Internal Medicine

## 2024-01-09 ENCOUNTER — Ambulatory Visit: Payer: Self-pay | Admitting: Cardiovascular Disease

## 2024-01-09 ENCOUNTER — Ambulatory Visit (HOSPITAL_COMMUNITY)
Admission: RE | Admit: 2024-01-09 | Discharge: 2024-01-09 | Payer: No Typology Code available for payment source | Attending: Internal Medicine | Admitting: Internal Medicine

## 2024-01-09 DIAGNOSIS — I359 Nonrheumatic aortic valve disorder, unspecified: Secondary | ICD-10-CM

## 2024-01-09 DIAGNOSIS — I340 Nonrheumatic mitral (valve) insufficiency: Secondary | ICD-10-CM

## 2024-01-09 DIAGNOSIS — I10 Essential (primary) hypertension: Secondary | ICD-10-CM

## 2024-01-09 DIAGNOSIS — I77819 Aortic ectasia, unspecified site: Secondary | ICD-10-CM

## 2024-01-09 DIAGNOSIS — I351 Nonrheumatic aortic (valve) insufficiency: Secondary | ICD-10-CM

## 2024-01-09 DIAGNOSIS — I493 Ventricular premature depolarization: Secondary | ICD-10-CM

## 2024-01-09 LAB — ECHOCARDIOGRAM COMPLETE
Area-P 1/2: 3.26 cm2
MV M vel: 4.65 m/s
MV Peak grad: 86.3 mmHg
P 1/2 time: 718 ms
S' Lateral: 3.2 cm

## 2024-02-25 ENCOUNTER — Other Ambulatory Visit: Payer: Self-pay | Admitting: Nurse Practitioner

## 2024-03-15 ENCOUNTER — Ambulatory Visit: Admitting: Internal Medicine

## 2024-03-22 ENCOUNTER — Ambulatory Visit (HOSPITAL_BASED_OUTPATIENT_CLINIC_OR_DEPARTMENT_OTHER): Admitting: Nurse Practitioner

## 2024-04-04 ENCOUNTER — Encounter: Admitting: Family
